# Patient Record
Sex: Female | Born: 1990
Health system: Southern US, Community
[De-identification: ages and names within clinical notes are randomized; demographics above are authoritative.]

## PROBLEM LIST (undated history)

## (undated) DIAGNOSIS — D649 Anemia, unspecified: Secondary | ICD-10-CM

## (undated) DIAGNOSIS — J309 Allergic rhinitis, unspecified: Secondary | ICD-10-CM

## (undated) DIAGNOSIS — N926 Irregular menstruation, unspecified: Secondary | ICD-10-CM

## (undated) DIAGNOSIS — J358 Other chronic diseases of tonsils and adenoids: Secondary | ICD-10-CM

## (undated) DIAGNOSIS — G43909 Migraine, unspecified, not intractable, without status migrainosus: Secondary | ICD-10-CM

## (undated) DIAGNOSIS — E876 Hypokalemia: Secondary | ICD-10-CM

## (undated) DIAGNOSIS — J45909 Unspecified asthma, uncomplicated: Secondary | ICD-10-CM

## (undated) DIAGNOSIS — R569 Unspecified convulsions: Secondary | ICD-10-CM

## (undated) DIAGNOSIS — R011 Cardiac murmur, unspecified: Secondary | ICD-10-CM

## (undated) DIAGNOSIS — F411 Generalized anxiety disorder: Secondary | ICD-10-CM

## (undated) DIAGNOSIS — T7840XA Allergy, unspecified, initial encounter: Secondary | ICD-10-CM

## (undated) DIAGNOSIS — L509 Urticaria, unspecified: Secondary | ICD-10-CM

## (undated) DIAGNOSIS — E785 Hyperlipidemia, unspecified: Secondary | ICD-10-CM

## (undated) HISTORY — DX: Irregular menstruation, unspecified: N92.6

## (undated) HISTORY — DX: Allergic rhinitis, unspecified: J30.9

## (undated) HISTORY — DX: Allergy, unspecified, initial encounter: T78.40XA

## (undated) HISTORY — PX: TONSILLECTOMY: SUR1361

## (undated) HISTORY — DX: Migraine, unspecified, not intractable, without status migrainosus: G43.909

## (undated) HISTORY — DX: Urticaria, unspecified: L50.9

## (undated) HISTORY — DX: Hypokalemia: E87.6

## (undated) HISTORY — DX: Cardiac murmur, unspecified: R01.1

## (undated) HISTORY — DX: Unspecified convulsions: R56.9

## (undated) HISTORY — DX: Hyperlipidemia, unspecified: E78.5

## (undated) HISTORY — PX: WISDOM TOOTH EXTRACTION: SHX21

## (undated) HISTORY — DX: Unspecified asthma, uncomplicated: J45.909

## (undated) HISTORY — DX: Anemia, unspecified: D64.9

## (undated) HISTORY — DX: Other chronic diseases of tonsils and adenoids: J35.8

## (undated) HISTORY — DX: Generalized anxiety disorder: F41.1

---
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
## 2015-08-10 ENCOUNTER — Emergency Department (HOSPITAL_COMMUNITY)
Admission: EM | Admit: 2015-08-10 | Discharge: 2015-08-10 | Disposition: A | Discharge status: Home / Self Care | Payer: BLUE CROSS/BLUE SHIELD | Attending: Emergency Medicine | Admitting: Emergency Medicine

## 2015-08-10 ENCOUNTER — Encounter (HOSPITAL_COMMUNITY): Payer: Self-pay | Admitting: *Deleted

## 2015-08-10 DIAGNOSIS — L731 Pseudofolliculitis barbae: Secondary | ICD-10-CM | POA: Diagnosis not present

## 2015-08-10 DIAGNOSIS — M542 Cervicalgia: Secondary | ICD-10-CM | POA: Diagnosis present

## 2015-08-10 MED ORDER — BACITRACIN ZINC 500 UNIT/GM EX OINT
TOPICAL_OINTMENT | Freq: Two times a day (BID) | CUTANEOUS | Status: DC
  Administered 2015-08-10: 23:00:00 via TOPICAL
  Filled 2015-08-10: qty 0.9

## 2015-08-10 NOTE — ED Notes (Signed)
Pt complains of boil to right neck since yesterday. Pt states she noticed a head on the boil today. Pt denies difficulty breathing or swallowing.

## 2015-08-10 NOTE — ED Provider Notes (Signed)
CSN: [id number]     Arrival date & time 08/10/15  2222 History  By signing my name below, I, Budd PalmerVanessa Prueter, attest that this documentation has been prepared under the direction and in the presence of Arman FilterGail K. Milynn Quirion, NP. Electronically Signed: Budd PalmerVanessa Prueter, ED Scribe. 08/10/2015. 10:50 PM.    Chief Complaint  Patient presents with  . Abscess   The history is provided by the patient. No language interpreter was used.   HPI Comments: Jasmine [name] is a 25 y.o. female who presents to the Emergency Department complaining of a possible abscess to the anterior neck onset yesterday.  Pt states that at first she thought it was just a zit, but that it has since grown in size and has also become more painful. Pt denies dental pain.   History reviewed. No pertinent past medical history. History reviewed. No pertinent past surgical history. No family history on file. Social History  Substance Use Topics  . Smoking status: Never Smoker   . Smokeless tobacco: None  . Alcohol Use: No   OB History    No data available     Review of Systems  Constitutional: Negative for fever.  HENT: Negative for dental problem.   Musculoskeletal: Positive for myalgias.  Skin: Positive for wound.  All other systems reviewed and are negative.   Allergies  Review of patient's allergies indicates not on file.  Home Medications   Prior to Admission medications   Not on File   BP 139/93 mmHg  Pulse 81  Temp(Src) 98.2 F (36.8 C) (Oral)  Resp 16  SpO2 99%  LMP 07/24/2015 Physical Exam  Constitutional: She is oriented to person, place, and time. She appears well-developed and well-nourished.  HENT:  Head: Normocephalic and atraumatic.  Eyes: Conjunctivae are normal. Right eye exhibits no discharge. Left eye exhibits no discharge.  Pulmonary/Chest: Effort normal. No respiratory distress.  Neurological: She is alert and oriented to person, place, and time. Coordination normal.  Skin: Skin is  warm and dry. No rash noted. She is not diaphoretic. No erythema.  Small white domed "pimple" R side of neck without surrounding erythema or swelling   Psychiatric: She has a normal mood and affect.  Nursing note and vitals reviewed.   ED Course  Procedures  DIAGNOSTIC STUDIES: Oxygen Saturation is 99% on RA, normal by my interpretation.    COORDINATION OF CARE: 10:49 PM - Discussed probable ingrown hair, which was freed. Discussed plans to apply antibiotic ointment and a bandaid. Pt advised of plan for treatment and pt agrees.  Labs Review Labs Reviewed - No data to display  Imaging Review No results found. I have personally reviewed and evaluated these images and lab results as part of my medical decision-making.   EKG Interpretation None     Gentle pressure on the "pimple" caused the area to open and reveal an ingrown hair  MDM   Final diagnoses:  Ingrown hair      Earley FavorGail Neelie Welshans, NP 08/10/15 2310  Melene Planan Floyd, DO 08/10/15 2348

## 2015-08-10 NOTE — Discharge Instructions (Signed)
Ingrown Hair An ingrown hair is a hair that curls and re-enters the skin instead of growing straight out of the skin. It happens most often with curly hair. It is usually more severe in the neck area, but it can occur in any shaved area, including the beard area, groin, scalp, and legs. An ingrown hair may cause small pockets of infection. CAUSES  Shaving closely, tweezing, or waxing, especially curly hair. Using hair removal creams can sometimes lead to ingrown hairs, especially in the groin. SYMPTOMS   Small bumps on the skin. The bumps may be filled with pus.  Pain.  Itching. DIAGNOSIS  Your caregiver can usually tell what is wrong by doing a physical exam. TREATMENT  If there is a severe infection, your caregiver may prescribe antibiotic medicines. Laser hair removal may also be done to help prevent regrowth of the hair. HOME CARE INSTRUCTIONS   Do not shave irritated skin. You may start shaving again once the irritation has gone away.  If you are prone to ingrown hairs, consider not shaving as much as possible.  If antibiotics are prescribed, take them as directed. Finish them even if you start to feel better.  You may use a facial sponge in a gentle circular motion to help dislodge ingrown hairs on the face.  You may use a hair removal cream weekly, especially on the legs and underarms. Stop using the cream if it irritates your skin. Use caution when using hair removal creams in the groin area. SHAVING INSTRUCTIONS AFTER TREATMENT  Shower before shaving. Keep areas to be shaved packed in warm, moist wraps for several minutes before shaving. The warm, moist environment helps soften the hairs and makes ingrown hairs less likely to occur.  Use thick shaving gels.  Use a bump fighter razor that cuts hair slightly above the skin level or use an electric shaver with a longer shave setting.  Shave in the direction of hair growth. Avoid making multiple razor strokes.  Use  moisturizing lotions after shaving.   This information is not intended to replace advice given to you by your health care provider. Make sure you discuss any questions you have with your health care provider.   Document Released: 08/13/2000 Document Revised: 11/06/2011 Document Reviewed: 08/07/2011 Elsevier Interactive Patient Education 2016 [employer]  

## 2015-12-01 ENCOUNTER — Ambulatory Visit: Payer: BLUE CROSS/BLUE SHIELD | Admitting: Family

## 2016-01-17 ENCOUNTER — Encounter (HOSPITAL_COMMUNITY): Payer: Self-pay | Admitting: Emergency Medicine

## 2016-01-17 ENCOUNTER — Ambulatory Visit (HOSPITAL_COMMUNITY)
Admission: EM | Admit: 2016-01-17 | Discharge: 2016-01-17 | Disposition: A | Discharge status: Home / Self Care | Payer: BLUE CROSS/BLUE SHIELD | Attending: Family Medicine | Admitting: Family Medicine

## 2016-01-17 DIAGNOSIS — J029 Acute pharyngitis, unspecified: Secondary | ICD-10-CM | POA: Diagnosis not present

## 2016-01-17 LAB — POCT RAPID STREP A: STREPTOCOCCUS, GROUP A SCREEN (DIRECT): NEGATIVE

## 2016-01-17 MED ORDER — LIDOCAINE VISCOUS 2 % MT SOLN: 20.0000 mL | OROMUCOSAL | 0 refills | Status: DC | PRN

## 2016-01-17 MED ORDER — AMOXICILLIN 875 MG PO TABS: 875.0000 mg | ORAL_TABLET | Freq: Two times a day (BID) | ORAL | 0 refills | Status: DC

## 2016-01-17 MED ORDER — ONDANSETRON 4 MG PO TBDP: 4.0000 mg | ORAL_TABLET | Freq: Three times a day (TID) | ORAL | 0 refills | Status: DC | PRN

## 2016-01-17 NOTE — ED Triage Notes (Signed)
[facility] a [facility] with nausea x 2 weeks. The patient stated that she has a hx of recurrent strep throat and has been advised to have her tonsils removed. The patient requested a referral.

## 2016-01-17 NOTE — ED Provider Notes (Signed)
CSN: [id number]     Arrival date & time 01/17/16  1148 History   None    Chief Complaint  Patient presents with  . Sore Throat   (Consider location/radiation/quality/duration/timing/severity/associated sxs/prior Treatment) Patient has sore throat and fever for 2 weeks.  She is also having nausea.   The history is provided by the patient.  Sore Throat  This is a new problem. The problem occurs constantly. The problem has been gradually worsening. Pertinent negatives include no chest pain. Nothing aggravates the symptoms. Nothing relieves the symptoms. She has tried nothing for the symptoms.    History reviewed. No pertinent past medical history. History reviewed. No pertinent surgical history. History reviewed. No pertinent family history. Social History  Substance Use Topics  . Smoking status: Never Smoker  . Smokeless tobacco: Never Used  . Alcohol use No   OB History    No data available     Review of Systems  Constitutional: Negative.   HENT: Positive for sore throat.   Eyes: Negative.   Respiratory: Negative.   Cardiovascular: Negative.  Negative for chest pain.  Gastrointestinal: Negative.   Endocrine: Negative.   Genitourinary: Negative.   Musculoskeletal: Negative.   Skin: Negative.   Allergic/Immunologic: Negative.   Neurological: Negative.   Hematological: Negative.   Psychiatric/Behavioral: Negative.     Allergies  Review of patient's allergies indicates no known allergies.  Home Medications   Prior to Admission medications   Not on File   Meds Ordered and Administered this Visit  Medications - No data to display  BP 136/83 (BP Location: Left Arm)   Pulse 88   Temp 100.1 F (37.8 C) (Oral)   Resp 18   LMP 12/08/2015 (Exact Date)   SpO2 100%  No data found.   Physical Exam  Constitutional: She is oriented to person, place, and time. She appears well-developed and well-nourished.  HENT:  Head: Normocephalic and atraumatic.  Right Ear:  External ear normal.  Left Ear: External ear normal.  opx injected and swollen tonsils w/o exudates  Eyes: Conjunctivae and EOM are normal. Pupils are equal, round, and reactive to light.  Neck: Normal range of motion. Neck supple.  Cardiovascular: Normal rate, regular rhythm and normal heart sounds.   Pulmonary/Chest: Effort normal and breath sounds normal.  Abdominal: Soft. Bowel sounds are normal.  Neurological: She is alert and oriented to person, place, and time.  Nursing note and vitals reviewed.   Urgent Care Course   Clinical Course    Procedures (including critical care time)  Labs Review Labs Reviewed  POCT RAPID STREP A    Imaging Review No results found.   Visual Acuity Review  Right Eye Distance:   Left Eye Distance:   Bilateral Distance:    Right Eye Near:   Left Eye Near:    Bilateral Near:         MDM  Pharyngitis - Amoxicillin 875mg  one po bid x 7 days #14 Lidocaine viscous solution 20 ml po tid prn #17720ml Push po fluids, rest, tylenol and motrin otc prn as directed for fever, arthralgias, and myalgias.  Follow up prn if sx's continue or persist.    Deatra CanterWilliam J Keiffer Piper, FNP 01/17/16 [phone]

## 2016-01-19 LAB — CULTURE, GROUP A STREP (THRC)

## 2016-05-21 ENCOUNTER — Encounter (HOSPITAL_COMMUNITY): Payer: Self-pay | Admitting: Emergency Medicine

## 2016-05-21 ENCOUNTER — Ambulatory Visit (HOSPITAL_COMMUNITY)
Admission: EM | Admit: 2016-05-21 | Discharge: 2016-05-21 | Disposition: A | Discharge status: Home / Self Care | Payer: BLUE CROSS/BLUE SHIELD | Attending: Family Medicine | Admitting: Family Medicine

## 2016-05-21 DIAGNOSIS — J029 Acute pharyngitis, unspecified: Secondary | ICD-10-CM

## 2016-05-21 MED ORDER — AZITHROMYCIN 250 MG PO TABS: ORAL_TABLET | ORAL | 0 refills | Status: DC

## 2016-05-21 MED ORDER — IPRATROPIUM BROMIDE 0.06 % NA SOLN: 2.0000 | Freq: Four times a day (QID) | NASAL | 1 refills | Status: DC

## 2016-05-21 NOTE — ED Triage Notes (Signed)
PT reports sore throat for 2 weeks that has worsened over last 3 days. PT also reports productive cough and congestion for 1 week. PT has tried several OTC meds

## 2016-05-21 NOTE — ED Provider Notes (Signed)
[facility]    CSN: [id number] Arrival date & time: 05/21/16  1003     History   Chief Complaint Chief Complaint  Patient presents with  . Sore Throat    HPI Jasmine [name] is a 26 y.o. female.   The history is provided by the patient.  Sore Throat  This is a new problem. The current episode started more than 1 week ago (2wks of sx.). The problem has been gradually worsening. Pertinent negatives include no chest pain, no abdominal pain, no headaches and no shortness of breath. The symptoms are aggravated by swallowing.    History reviewed. No pertinent past medical history.  There are no active problems to display for this patient.   History reviewed. No pertinent surgical history.  OB History    No data available       Home Medications    Prior to Admission medications   Medication Sig Start Date End Date Taking? Authorizing Provider  amoxicillin (AMOXIL) 875 MG tablet Take 1 tablet (875 mg total) by mouth 2 (two) times daily. 01/17/16   Deatra CanterWilliam J Oxford, FNP  lidocaine (XYLOCAINE) 2 % solution Use as directed 20 mLs in the mouth or throat as needed for mouth pain. 01/17/16   Deatra CanterWilliam J Oxford, FNP  ondansetron (ZOFRAN ODT) 4 MG disintegrating tablet Take 1 tablet (4 mg total) by mouth every 8 (eight) hours as needed for nausea or vomiting. 01/17/16   Deatra CanterWilliam J Oxford, FNP    Family History No family history on file.  Social History Social History  Substance Use Topics  . Smoking status: Never Smoker  . Smokeless tobacco: Never Used  . Alcohol use No     Allergies   Patient has no known allergies.   Review of Systems Review of Systems  Constitutional: Negative.   HENT: Positive for congestion, postnasal drip, rhinorrhea and sore throat.   Respiratory: Positive for cough. Negative for shortness of breath.   Cardiovascular: Negative.  Negative for chest pain.  Gastrointestinal: Negative.  Negative for abdominal pain.  Neurological:  Negative for headaches.     Physical Exam Triage Vital Signs ED Triage Vitals  Enc Vitals Group     BP 05/21/16 1021 135/88     Pulse Rate 05/21/16 1021 86     Resp 05/21/16 1021 16     Temp 05/21/16 1021 97.9 F (36.6 C)     Temp Source 05/21/16 1021 Oral     SpO2 05/21/16 1021 98 %     Weight 05/21/16 1023 175 lb (79.4 kg)     Height 05/21/16 1023 5\' 2"  (1.575 m)     Head Circumference --      Peak Flow --      Pain Score 05/21/16 1024 7     Pain Loc --      Pain Edu? --      Excl. in [place]? --    No data found.   Updated Vital Signs BP 135/88   Pulse 86   Temp 97.9 F (36.6 C) (Oral)   Resp 16   Ht 5\' 2"  (1.575 m)   Wt 175 lb (79.4 kg)   SpO2 98%   BMI 32.01 kg/m   Visual Acuity Right Eye Distance:   Left Eye Distance:   Bilateral Distance:    Right Eye Near:   Left Eye Near:    Bilateral Near:     Physical Exam  Constitutional: She is oriented to person, place, and time. She  appears well-developed and well-nourished.  HENT:  Right Ear: External ear normal.  Left Ear: External ear normal.  Nose: Mucosal edema and rhinorrhea present. No sinus tenderness.  Mouth/Throat: Oropharynx is clear and moist.  Neck: Normal range of motion.  Cardiovascular: Normal rate, regular rhythm, normal heart sounds and intact distal pulses.   Pulmonary/Chest: Effort normal and breath sounds normal.  Lymphadenopathy:    She has no cervical adenopathy.  Neurological: She is alert and oriented to person, place, and time.  Skin: Skin is warm and dry.  Nursing note and vitals reviewed.    UC Treatments / Results  Labs (all labs ordered are listed, but only abnormal results are displayed) Labs Reviewed - No data to display  EKG  EKG Interpretation None       Radiology No results found.  Procedures Procedures (including critical care time)  Medications Ordered in UC Medications - No data to display   Initial Impression / Assessment and Plan / UC Course  I  have reviewed the triage vital signs and the nursing notes.  Pertinent labs & imaging results that were available during my care of the patient were reviewed by me and considered in my medical decision making (see chart for details).  Clinical Course       Final Clinical Impressions(s) / UC Diagnoses   Final diagnoses:  None    New Prescriptions New Prescriptions   No medications on file     Linna Hoff, MD 06/05/16 2053

## 2016-05-21 NOTE — Discharge Instructions (Signed)
Drink lots of fluids, take all of medicine, use lozenges as needed.return if needed °

## 2018-02-26 ENCOUNTER — Other Ambulatory Visit: Payer: Self-pay | Admitting: Family Medicine

## 2018-02-26 DIAGNOSIS — R5381 Other malaise: Secondary | ICD-10-CM

## 2018-03-19 ENCOUNTER — Other Ambulatory Visit: Payer: Self-pay | Admitting: Family Medicine

## 2018-03-19 DIAGNOSIS — N926 Irregular menstruation, unspecified: Secondary | ICD-10-CM

## 2018-03-21 ENCOUNTER — Other Ambulatory Visit: Payer: Self-pay | Admitting: Family Medicine

## 2018-03-21 DIAGNOSIS — N926 Irregular menstruation, unspecified: Secondary | ICD-10-CM

## 2018-03-21 DIAGNOSIS — E282 Polycystic ovarian syndrome: Secondary | ICD-10-CM

## 2019-02-18 ENCOUNTER — Ambulatory Visit (HOSPITAL_COMMUNITY)
Admission: EM | Admit: 2019-02-18 | Discharge: 2019-02-18 | Disposition: A | Discharge status: Home / Self Care | Payer: BC Managed Care – PPO

## 2019-02-18 ENCOUNTER — Encounter (HOSPITAL_COMMUNITY): Payer: Self-pay | Admitting: Emergency Medicine

## 2019-02-18 ENCOUNTER — Other Ambulatory Visit: Payer: Self-pay

## 2019-02-18 DIAGNOSIS — L239 Allergic contact dermatitis, unspecified cause: Secondary | ICD-10-CM

## 2019-02-18 MED ORDER — METHYLPREDNISOLONE SODIUM SUCC 125 MG IJ SOLR: 125.0000 mg | Freq: Once | INTRAMUSCULAR | Status: DC

## 2019-02-18 MED ORDER — PREDNISONE 10 MG (21) PO TBPK: ORAL_TABLET | Freq: Every day | ORAL | 0 refills | Status: DC

## 2019-02-18 MED ORDER — METHYLPREDNISOLONE SODIUM SUCC 125 MG IJ SOLR
INTRAMUSCULAR | Status: AC
  Filled 2019-02-18: qty 2

## 2019-02-18 NOTE — Discharge Instructions (Addendum)
Take the prednisone as directed.      Continue to take Benadryl every 6 hours as needed for itching.    Stop using the new body lotion.    Return here or follow-up with your primary care provider if your symptoms or not improving.

## 2019-02-18 NOTE — ED Triage Notes (Signed)
Patient complains of itchy skin to arms, irritated feeling.  This itchiness started on Monday.    Patient has a new cat x 1 month  cetaphil lotion tried over the weekend-has been using this product on face with no issue

## 2019-02-18 NOTE — ED Provider Notes (Addendum)
[facility]    CSN: [id number] Arrival date & time: 02/18/19  3664      History   Chief Complaint Chief Complaint  Patient presents with  . Rash    HPI Jasmine [name] is a 28 y.o. female.   Patient presents with pruritic rash on her arms x3 days.  She began to use a new body lotion 4 days ago.  She also reports a new cat at home x1 month.  She has been treating this at home with Benadryl which has improved the rash.  She denies other symptoms including fever, chills, sore throat, difficulty swallowing, difficulty breathing, cough.  LMP: 02/18/2019.    The history is provided by the patient.    History reviewed. No pertinent past medical history.  There are no active problems to display for this patient.   History reviewed. No pertinent surgical history.  OB History   No obstetric history on file.      Home Medications    Prior to Admission medications   Medication Sig Start Date End Date Taking? Authorizing Provider  diphenhydrAMINE (BENADRYL) 25 MG tablet Take 25 mg by mouth every 6 (six) hours as needed.   Yes [provider]  Lisdexamfetamine Dimesylate (VYVANSE PO) Take by mouth.   Yes [provider]  amoxicillin (AMOXIL) 875 MG tablet Take 1 tablet (875 mg total) by mouth 2 (two) times daily. 01/17/16   Deatra Canter, FNP  azithromycin (ZITHROMAX Z-PAK) 250 MG tablet Take as directed on pack 05/21/16   Linna Hoff, MD  ipratropium (ATROVENT) 0.06 % nasal spray Place 2 sprays into both nostrils 4 (four) times daily. 05/21/16   Linna Hoff, MD  lidocaine (XYLOCAINE) 2 % solution Use as directed 20 mLs in the mouth or throat as needed for mouth pain. 01/17/16   Deatra Canter, FNP  ondansetron (ZOFRAN ODT) 4 MG disintegrating tablet Take 1 tablet (4 mg total) by mouth every 8 (eight) hours as needed for nausea or vomiting. 01/17/16   Deatra Canter, FNP  predniSONE (STERAPRED UNI-PAK 21 TAB) 10 MG (21) TBPK tablet  Take by mouth daily. Take 6 tabs by mouth daily  for 1 day, then 5 tabs for 1 day, then 4 tabs for 1 day, then 3 tabs for 1 day, 2 tabs for 1 day, then 1 tab by mouth daily for 1 day 02/18/19   Mickie Bail, NP    Family History Family History  Problem Relation Age of Onset  . Diabetes Mother   . Hypertension Mother   . Diabetes Father   . Hypertension Father     Social History Social History   Tobacco Use  . Smoking status: Never Smoker  . Smokeless tobacco: Never Used  Substance Use Topics  . Alcohol use: No  . Drug use: No     Allergies   Patient has no known allergies.   Review of Systems Review of Systems  Constitutional: Negative for chills and fever.  HENT: Negative for congestion, ear pain, rhinorrhea, sore throat and trouble swallowing.   Eyes: Negative for pain and visual disturbance.  Respiratory: Negative for cough and shortness of breath.   Cardiovascular: Negative for chest pain and palpitations.  Gastrointestinal: Negative for abdominal pain and vomiting.  Genitourinary: Negative for dysuria and hematuria.  Musculoskeletal: Negative for arthralgias and back pain.  Skin: Positive for rash. Negative for color change.  Neurological: Negative for seizures and syncope.  All other systems reviewed  and are negative.    Physical Exam Triage Vital Signs ED Triage Vitals  Enc Vitals Group     BP 02/18/19 0954 128/69     Pulse Rate 02/18/19 0954 91     Resp 02/18/19 0954 18     Temp 02/18/19 0954 98 F (36.7 C)     Temp Source 02/18/19 0954 Oral     SpO2 02/18/19 0954 100 %     Weight --      Height --      Head Circumference --      Peak Flow --      Pain Score 02/18/19 0947 0     Pain Loc --      Pain Edu? --      Excl. in [place]? --    No data found.  Updated Vital Signs BP 128/69 (BP Location: Right Arm)   Pulse 91   Temp 98 F (36.7 C) (Oral)   Resp 18   LMP 02/18/2019   SpO2 100%   Visual Acuity Right Eye Distance:   Left Eye  Distance:   Bilateral Distance:    Right Eye Near:   Left Eye Near:    Bilateral Near:     Physical Exam Vitals signs and nursing note reviewed.  Constitutional:      General: She is not in acute distress.    Appearance: She is well-developed.  HENT:     Head: Normocephalic and atraumatic.     Right Ear: Tympanic membrane normal.     Left Ear: Tympanic membrane normal.     Nose: Nose normal.     Mouth/Throat:     Mouth: Mucous membranes are moist.     Pharynx: Oropharynx is clear.  Eyes:     Conjunctiva/sclera: Conjunctivae normal.  Neck:     Musculoskeletal: Neck supple.  Cardiovascular:     Rate and Rhythm: Normal rate and regular rhythm.     Heart sounds: No murmur.  Pulmonary:     Effort: Pulmonary effort is normal. No respiratory distress.     Breath sounds: Normal breath sounds.  Abdominal:     Palpations: Abdomen is soft.     Tenderness: There is no abdominal tenderness. There is no guarding or rebound.  Skin:    General: Skin is warm and dry.     Findings: Rash present.     Comments: Papular rash on UE.  No drainage or erythema.    Neurological:     General: No focal deficit present.     Mental Status: She is alert and oriented to person, place, and time.      UC Treatments / Results  Labs (all labs ordered are listed, but only abnormal results are displayed) Labs Reviewed - No data to display  EKG   Radiology No results found.  Procedures Procedures (including critical care time)  Medications Ordered in UC Medications  methylPREDNISolone sodium succinate (SOLU-MEDROL) 125 mg/2 mL injection 125 mg (has no administration in time range)  methylPREDNISolone sodium succinate (SOLU-MEDROL) 125 mg/2 mL injection (has no administration in time range)    Initial Impression / Assessment and Plan / UC Course  I have reviewed the triage vital signs and the nursing notes.  Pertinent labs & imaging results that were available during my care of the patient  were reviewed by me and considered in my medical decision making (see chart for details).    Allergic dermatitis.  Treating with prednisone (patient refused injection).  Instructed patient  to continue taking Benadryl every 6 hours as needed.  Discussed that she should stop using the new body lotion.  Discussed that if her symptoms are not improving, consider whether she is allergic to the new cat.  Instructed her to return here or follow-up with her PCP if her symptoms are not improving.  Patient agrees to plan of care.     Final Clinical Impressions(s) / UC Diagnoses   Final diagnoses:  Allergic dermatitis     Discharge Instructions     Take the prednisone as directed.      Continue to take Benadryl every 6 hours as needed for itching.    Stop using the new body lotion.    Return here or follow-up with your primary care provider if your symptoms or not improving.          ED Prescriptions    Medication Sig Dispense Auth. Provider   predniSONE (STERAPRED UNI-PAK 21 TAB) 10 MG (21) TBPK tablet Take by mouth daily. Take 6 tabs by mouth daily  for 1 day, then 5 tabs for 1 day, then 4 tabs for 1 day, then 3 tabs for 1 day, 2 tabs for 1 day, then 1 tab by mouth daily for 1 day 21 tablet Daisa Stennis H, NP     PDMP notMickie Bail reviewed this encounter.   Mickie Bailate, Mahreen Schewe H, NP 02/18/19 1011    Mickie Bailate, Amritpal Shropshire H, NP 02/18/19 1020    Mickie Bailate, Dominic Mahaney H, NP 02/18/19 1020

## 2019-02-20 ENCOUNTER — Ambulatory Visit (HOSPITAL_COMMUNITY)
Admission: EM | Admit: 2019-02-20 | Discharge: 2019-02-20 | Disposition: A | Discharge status: Home / Self Care | Payer: BC Managed Care – PPO | Attending: Physician Assistant | Admitting: Physician Assistant

## 2019-02-20 ENCOUNTER — Encounter (HOSPITAL_COMMUNITY): Payer: Self-pay

## 2019-02-20 ENCOUNTER — Other Ambulatory Visit: Payer: Self-pay

## 2019-02-20 DIAGNOSIS — R21 Rash and other nonspecific skin eruption: Secondary | ICD-10-CM

## 2019-02-20 MED ORDER — HYDROXYZINE HCL 25 MG PO TABS: 25.0000 mg | ORAL_TABLET | Freq: Four times a day (QID) | ORAL | 0 refills | Status: DC | PRN

## 2019-02-20 NOTE — Discharge Instructions (Addendum)
Your rash appears allergic and will not go away until the allergen is removed. Start Zyrtec OTC daily. May take Hydroxyzine every 6 hours for itching and will make you sleepy. Finish the prednisone and take Benadryl 25mg  every 6 hours for reaction. If rash continues then f/u with an allergist for further evaluation.

## 2019-02-20 NOTE — ED Provider Notes (Signed)
[facility]    CSN: [id number] Arrival date & time: 02/20/19  1932      History   Chief Complaint Chief Complaint  Patient presents with  . Follow-up    HPI Jasmine [name] is a 28 y.o. female.   Presents with an ongoing rash since she was here on Tuesday. She states the itching is keeping her awake at night. No change in rash, some clearing but itching remains. No additional symptom that have manifested since Tuesday.      History reviewed. No pertinent past medical history.  There are no active problems to display for this patient.   History reviewed. No pertinent surgical history.  OB History   No obstetric history on file.      Home Medications    Prior to Admission medications   Medication Sig Start Date End Date Taking? Authorizing Provider  amoxicillin (AMOXIL) 875 MG tablet Take 1 tablet (875 mg total) by mouth 2 (two) times daily. 01/17/16   Deatra Canter, FNP  azithromycin (ZITHROMAX Z-PAK) 250 MG tablet Take as directed on pack 05/21/16   Linna Hoff, MD  diphenhydrAMINE (BENADRYL) 25 MG tablet Take 25 mg by mouth every 6 (six) hours as needed.    [provider]  hydrOXYzine (ATARAX/VISTARIL) 25 MG tablet Take 1 tablet (25 mg total) by mouth every 6 (six) hours as needed for itching. 02/20/19   Riki Sheer, PA-C  ipratropium (ATROVENT) 0.06 % nasal spray Place 2 sprays into both nostrils 4 (four) times daily. 05/21/16   Linna Hoff, MD  lidocaine (XYLOCAINE) 2 % solution Use as directed 20 mLs in the mouth or throat as needed for mouth pain. 01/17/16   Deatra Canter, FNP  Lisdexamfetamine Dimesylate (VYVANSE PO) Take by mouth.    [provider]  ondansetron (ZOFRAN ODT) 4 MG disintegrating tablet Take 1 tablet (4 mg total) by mouth every 8 (eight) hours as needed for nausea or vomiting. 01/17/16   Deatra Canter, FNP  predniSONE (STERAPRED UNI-PAK 21 TAB) 10 MG (21) TBPK tablet Take by mouth  daily. Take 6 tabs by mouth daily  for 1 day, then 5 tabs for 1 day, then 4 tabs for 1 day, then 3 tabs for 1 day, 2 tabs for 1 day, then 1 tab by mouth daily for 1 day 02/18/19   Mickie Bail, NP    Family History Family History  Problem Relation Age of Onset  . Diabetes Mother   . Hypertension Mother   . Diabetes Father   . Hypertension Father     Social History Social History   Tobacco Use  . Smoking status: Never Smoker  . Smokeless tobacco: Never Used  Substance Use Topics  . Alcohol use: No  . Drug use: No     Allergies   Patient has no known allergies.   Review of Systems Review of Systems  All other systems reviewed and are negative.    Physical Exam Triage Vital Signs ED Triage Vitals  Enc Vitals Group     BP 02/20/19 1950 (!) 142/99     Pulse Rate 02/20/19 1950 77     Resp 02/20/19 1950 17     Temp 02/20/19 1950 98.5 F (36.9 C)     Temp Source 02/20/19 1950 Oral     SpO2 02/20/19 1950 98 %     Weight --      Height --      Head Circumference --  Peak Flow --      Pain Score 02/20/19 1949 0     Pain Loc --      Pain Edu? --      Excl. in [place]? --    No data found.  Updated Vital Signs BP (!) 142/99 (BP Location: Right Arm)   Pulse 77   Temp 98.5 F (36.9 C) (Oral)   Resp 17   LMP 02/18/2019   SpO2 98%   Visual Acuity Right Eye Distance:   Left Eye Distance:   Bilateral Distance:    Right Eye Near:   Left Eye Near:    Bilateral Near:     Physical Exam Vitals signs and nursing note reviewed.  Constitutional:      General: She is not in acute distress.    Appearance: Normal appearance. She is not ill-appearing.  HENT:     Head: Normocephalic and atraumatic.  Skin:    General: Skin is warm and dry.     Findings: Rash present.     Comments: Mild papular rash to both forearms  Neurological:     Mental Status: She is alert.  Psychiatric:        Mood and Affect: Mood normal.        Behavior: Behavior normal.      UC  Treatments / Results  Labs (all labs ordered are listed, but only abnormal results are displayed) Labs Reviewed  NOVEL CORONAVIRUS, NAA    EKG   Radiology No results found.  Procedures Procedures (including critical care time)  Medications Ordered in UC Medications - No data to display  Initial Impression / Assessment and Plan / UC Course  I have reviewed the triage vital signs and the nursing notes.  Pertinent labs & imaging results that were available during my care of the patient were reviewed by me and considered in my medical decision making (see chart for details).     Continue prednisone add Zyrtec and Hydroxyzine for itching. Give additional time for clearing. Consider allergist f/u if continues.  Final Clinical Impressions(s) / UC Diagnoses   Final diagnoses:  Rash and nonspecific skin eruption     Discharge Instructions     Your rash appears allergic and will not go away until the allergen is removed. Start Zyrtec OTC daily. May take Hydroxyzine every 6 hours for itching and will make you sleepy. Finish the prednisone and take Benadryl 25mg  every 6 hours for reaction. If rash continues then f/u with an allergist for further evaluation.    ED Prescriptions    Medication Sig Dispense Auth. Provider   hydrOXYzine (ATARAX/VISTARIL) 25 MG tablet Take 1 tablet (25 mg total) by mouth every 6 (six) hours as needed for itching. 15 tablet Bjorn Pippin, [place]     PDMP not reviewed this encounter.   Bjorn Pippin, [place] 02/20/19 2007

## 2019-02-20 NOTE — ED Triage Notes (Signed)
Patient presents to Urgent Care with complaints of needing follow-up for an allergic reaction since her itching does not seem to be improving. Patient reports the rash is worse on her arms. Pt states she still has the cat and it spends a lot of time in her room.

## 2019-02-26 ENCOUNTER — Other Ambulatory Visit: Payer: Self-pay

## 2019-02-26 ENCOUNTER — Encounter (HOSPITAL_COMMUNITY): Payer: Self-pay

## 2019-02-26 ENCOUNTER — Ambulatory Visit (HOSPITAL_COMMUNITY)
Admission: EM | Admit: 2019-02-26 | Discharge: 2019-02-26 | Disposition: A | Discharge status: Home / Self Care | Payer: BC Managed Care – PPO | Attending: Emergency Medicine | Admitting: Emergency Medicine

## 2019-02-26 DIAGNOSIS — L509 Urticaria, unspecified: Secondary | ICD-10-CM | POA: Insufficient documentation

## 2019-02-26 DIAGNOSIS — Z20828 Contact with and (suspected) exposure to other viral communicable diseases: Secondary | ICD-10-CM | POA: Insufficient documentation

## 2019-02-26 DIAGNOSIS — R21 Rash and other nonspecific skin eruption: Secondary | ICD-10-CM | POA: Diagnosis not present

## 2019-02-26 MED ORDER — DIPHENHYDRAMINE HCL 50 MG/ML IJ SOLN
25.0000 mg | Freq: Once | INTRAMUSCULAR | Status: AC
  Administered 2019-02-26: 25 mg via INTRAMUSCULAR

## 2019-02-26 MED ORDER — NEOMYCIN-POLYMYXIN-PRAMOXINE 1 % EX CREA: TOPICAL_CREAM | Freq: Two times a day (BID) | CUTANEOUS | Status: DC

## 2019-02-26 MED ORDER — HYDROXYZINE HCL 25 MG PO TABS: 25.0000 mg | ORAL_TABLET | Freq: Four times a day (QID) | ORAL | 1 refills | Status: DC | PRN

## 2019-02-26 NOTE — ED Provider Notes (Signed)
[place]    CSN: [id number] Arrival date & time: 02/26/19  1906      History   Chief Complaint Chief Complaint  Patient presents with  . Rash  . Shortness of Breath    HPI Jasmine [name] is a 28 y.o. female.   Pt states that she has had this rash since 9/28 itching, got  A new cat 2 motnhs ago but does not feel that this is the problem bumps states that she has been here several times for this no relief. Also states that she is worried it is the covid rash and would like to be tested. Has some sob today only with itching. Has been taking benadryl, hydrazine, steroids, with no relief. No fever, no cough, rash is over entire body. Has called derm but can not see them till she is off steroid for 3 days . Is asking for a covid test ,,      History reviewed. No pertinent past medical history.  There are no active problems to display for this patient.   History reviewed. No pertinent surgical history.  OB History   No obstetric history on file.      Home Medications    Prior to Admission medications   Medication Sig Start Date End Date Taking? Authorizing Provider  cetirizine (ZYRTEC) 10 MG tablet Take 10 mg by mouth daily.   Yes [provider]  amoxicillin (AMOXIL) 875 MG tablet Take 1 tablet (875 mg total) by mouth 2 (two) times daily. 01/17/16   Lysbeth Penner, FNP  azithromycin (ZITHROMAX Z-PAK) 250 MG tablet Take as directed on pack 05/21/16   Billy Fischer, MD  diphenhydrAMINE (BENADRYL) 25 MG tablet Take 25 mg by mouth every 6 (six) hours as needed.    [provider]  hydrOXYzine (ATARAX/VISTARIL) 25 MG tablet Take 1 tablet (25 mg total) by mouth every 6 (six) hours as needed for itching. 02/26/19   Marney Setting, NP  ipratropium (ATROVENT) 0.06 % nasal spray Place 2 sprays into both nostrils 4 (four) times daily. 05/21/16   Billy Fischer, MD  lidocaine (XYLOCAINE) 2 % solution Use as directed 20 mLs in the mouth  or throat as needed for mouth pain. 01/17/16   Lysbeth Penner, FNP  Lisdexamfetamine Dimesylate (VYVANSE PO) Take by mouth.    [provider]  ondansetron (ZOFRAN ODT) 4 MG disintegrating tablet Take 1 tablet (4 mg total) by mouth every 8 (eight) hours as needed for nausea or vomiting. 01/17/16   Lysbeth Penner, FNP  predniSONE (STERAPRED UNI-PAK 21 TAB) 10 MG (21) TBPK tablet Take by mouth daily. Take 6 tabs by mouth daily  for 1 day, then 5 tabs for 1 day, then 4 tabs for 1 day, then 3 tabs for 1 day, 2 tabs for 1 day, then 1 tab by mouth daily for 1 day 02/18/19   Sharion Balloon, NP    Family History Family History  Problem Relation Age of Onset  . Diabetes Mother   . Hypertension Mother   . Diabetes Father   . Hypertension Father     Social History Social History   Tobacco Use  . Smoking status: Never Smoker  . Smokeless tobacco: Never Used  Substance Use Topics  . Alcohol use: No  . Drug use: No     Allergies   Patient has no known allergies.   Review of Systems Review of Systems  Constitutional:  Anxious, upset   HENT: Negative.   Eyes: Negative.   Respiratory: Positive for shortness of breath.   Cardiovascular: Negative.   Gastrointestinal: Negative.   Skin: Positive for rash.       Small raised area to entire body, red scratching over several areas   Neurological: Negative.      Physical Exam Triage Vital Signs ED Triage Vitals  Enc Vitals Group     BP 02/26/19 1921 137/79     Pulse Rate 02/26/19 1921 99     Resp 02/26/19 1921 17     Temp 02/26/19 1921 98.3 F (36.8 C)     Temp Source 02/26/19 1921 Oral     SpO2 02/26/19 1921 100 %     Weight --      Height --      Head Circumference --      Peak Flow --      Pain Score 02/26/19 1918 0     Pain Loc --      Pain Edu? --      Excl. in [place]? --    No data found.  Updated Vital Signs BP 137/79 (BP Location: Right Arm)   Pulse 99   Temp 98.3 F (36.8 C) (Oral)   Resp 17   LMP  02/18/2019   SpO2 100%   Visual Acuity Right Eye Distance:   Left Eye Distance:   Bilateral Distance:    Right Eye Near:   Left Eye Near:    Bilateral Near:     Physical Exam Constitutional:      Comments: Anxious   HENT:     Mouth/Throat:     Mouth: Mucous membranes are moist.  Eyes:     Pupils: Pupils are equal, round, and reactive to light.  Neck:     Musculoskeletal: Normal range of motion.  Cardiovascular:     Rate and Rhythm: Normal rate.  Pulmonary:     Effort: Pulmonary effort is normal.  Abdominal:     Palpations: Abdomen is soft.  Skin:    Findings: Erythema and rash present.     Comments: Scratching marks pt is itching cont. No open pustules ,   Neurological:     General: No focal deficit present.     Mental Status: She is alert.      UC Treatments / Results  Labs (all labs ordered are listed, but only abnormal results are displayed) Labs Reviewed  NOVEL CORONAVIRUS, NAA (HOSP ORDER, SEND-OUT TO REF LAB; TAT 18-24 HRS)  SARS CORONAVIRUS 2 (TAT 6-24 HRS)    EKG   Radiology No results found.  Procedures Procedures (including critical care time)  Medications Ordered in UC Medications  neomycin-polymyxin-pramoxine (NEOSPORIN PLUS) cream (has no administration in time range)  diphenhydrAMINE (BENADRYL) injection 25 mg (25 mg Intramuscular Given 02/26/19 1955)    Initial Impression / Assessment and Plan / UC Course  I have reviewed the triage vital signs and the nursing notes.  Pertinent labs & imaging results that were available during my care of the patient were reviewed by me and considered in my medical decision making (see chart for details).    Md nelson seen pt  Reviewed pervious charts  given recommendations of meds pt thinks it is covid and is asking for a test .  Expressed that we will call if any results are positive. At this time MD does not think we can offer any further tx except OTC creams . And to double the zyrtec  dose .  May  need to r/o the possibility of cat allergy  Final Clinical Impressions(s) / UC Diagnoses   Final diagnoses:  Rash  Urticaria     Discharge Instructions     With speaking to MD Delton SeeNelson she recommends to treat with a topical cream. May double to dose of Zyrtec to q12 hrs and to take 2 25 mg benadryl at night. Will need to see dermatology to see for further causes .     ED Prescriptions    Medication Sig Dispense Auth. Provider   hydrOXYzine (ATARAX/VISTARIL) 25 MG tablet Take 1 tablet (25 mg total) by mouth every 6 (six) hours as needed for itching. 15 tablet Coralyn MarkMitchell, Cadience Bradfield L, NP     PDMP not reviewed this encounter.   Coralyn MarkMitchell, Aryon Nham L, NP 02/26/19 2017

## 2019-02-26 NOTE — ED Triage Notes (Addendum)
Pt reports shortness of breath x 2 days, rash in her arms, back and face is getting worse, she was seen for the same chief complaint 1 week ago at this UC.

## 2019-02-26 NOTE — Discharge Instructions (Addendum)
With speaking to MD Meda Coffee she recommends to treat with a topical cream. May double to dose of Zyrtec to q12 hrs and to take 2 25 mg benadryl at night. Will need to see dermatology to see for further causes .

## 2019-03-01 LAB — NOVEL CORONAVIRUS, NAA (HOSP ORDER, SEND-OUT TO REF LAB; TAT 18-24 HRS): SARS-CoV-2, NAA: NOT DETECTED

## 2019-03-09 ENCOUNTER — Other Ambulatory Visit: Payer: Self-pay

## 2019-03-09 ENCOUNTER — Encounter: Payer: Self-pay | Admitting: Allergy and Immunology

## 2019-03-09 ENCOUNTER — Ambulatory Visit (INDEPENDENT_AMBULATORY_CARE_PROVIDER_SITE_OTHER): Payer: BC Managed Care – PPO | Admitting: Allergy and Immunology

## 2019-03-09 VITALS — BP 126/78 | HR 100 | Temp 97.8°F | Resp 18 | Ht 61.5 in | Wt 172.8 lb

## 2019-03-09 DIAGNOSIS — L500 Allergic urticaria: Secondary | ICD-10-CM | POA: Diagnosis not present

## 2019-03-09 DIAGNOSIS — T7840XD Allergy, unspecified, subsequent encounter: Secondary | ICD-10-CM | POA: Diagnosis not present

## 2019-03-09 DIAGNOSIS — T7840XA Allergy, unspecified, initial encounter: Secondary | ICD-10-CM | POA: Insufficient documentation

## 2019-03-09 DIAGNOSIS — J310 Chronic rhinitis: Secondary | ICD-10-CM | POA: Diagnosis not present

## 2019-03-09 MED ORDER — LEVOCETIRIZINE DIHYDROCHLORIDE 5 MG PO TABS
5.0000 mg | ORAL_TABLET | Freq: Every day | ORAL | 5 refills | Status: DC | PRN
Start: 2019-03-09 — End: 2019-11-15

## 2019-03-09 MED ORDER — FAMOTIDINE 20 MG PO TABS: 20.0000 mg | ORAL_TABLET | Freq: Two times a day (BID) | ORAL | 5 refills | Status: DC | PRN

## 2019-03-09 MED ORDER — AZELASTINE HCL 0.1 % NA SOLN: 1.0000 | Freq: Two times a day (BID) | NASAL | 5 refills | Status: DC | PRN

## 2019-03-09 NOTE — Assessment & Plan Note (Addendum)
Unclear etiology.  Allergy skin tests to food and environmental allergens were negative today. NSAIDs and emotional stress commonly exacerbate urticaria but are not the underlying etiology in this case. Physical urticarias are negative by history (i.e. pressure-induced, temperature, vibration, solar, etc.). History and lesions are not consistent with urticaria pigmentosa so I am not suspicious for mastocytosis. There are no concomitant symptoms concerning for anaphylaxis or constitutional symptoms worrisome for an underlying malignancy.   We will not order labs at this time, however, if lesions recur, persist, progress, or change in character, we will assess potential etiologies with screening labs.  For symptom relief, patient is to take oral antihistamines as directed.  Instructions have been discussed and provided for H1/H2 receptor blockade with titration to find lowest effective dose.  A prescription has been provided for levocetirizine(Xyzal), 5 mg daily as needed.  A prescription has been provided for famotidine (Pepcid), 20 mg twice daily if needed.  Should symptoms recur, a journal is to be kept recording any foods eaten, beverages consumed, medications taken within a 6 hour period prior to the onset of symptoms, as well as record activities being performed, and environmental conditions. For any symptoms concerning for anaphylaxis, 911 is to be called immediately.

## 2019-03-09 NOTE — Assessment & Plan Note (Signed)
Environmental epicutaneous and intradermal tests were negative today despite a positive histamine control.  A prescription has been provided for azelastine nasal spray, 1-2 sprays per nostril 2 times daily as needed. Proper nasal spray technique has been discussed and demonstrated.   Nasal saline spray (i.e., Simply Saline) or nasal saline lavage (i.e., NeilMed) is recommended as needed and prior to medicated nasal sprays.

## 2019-03-09 NOTE — Progress Notes (Signed)
New Patient Note  RE: [name] MRN: [id number] DOB: 1991-01-29 Date of Office Visit: 03/09/2019  Referring provider: No ref. provider found Primary care provider: Fanny Bien, MD  Chief Complaint: Urticaria, Rash, and Nasal Congestion  History of present illness: Jasmine [name] is a 28 y.o. female presenting today for evaluation of allergic rhinitis and rash.  She reports that on September 28 she developed a rash on her hands and arms consisting of "small red bumps, really bad."  She went to the local urgent care for further evaluation and was treated with prednisone.  After the prednisone course ended "it came back even worse" with hives on her face and forehead.  Individual hives resolved within 24 hours without residual pigmentation or bruising, however the episode lasted for approximately 1 week.  She went back to urgent care and was instructed to seek evaluation by an allergist.  Approximately 2 weeks ago she acquired a cat brought in the house.  This was the family's first pet in the home.  As a result, she was concerned that there may have been a correlation between the hives and exposure to the cat.  Therefore, she removed the cat from her bedroom.  However, over the past week the cat has been back in the bedroom and she has not had a recurrence of the rash/hives. Laquitha experiences nasal congestion, rhinorrhea, postnasal drainage, and coughing.  No significant seasonal symptom variation has been noted nor have specific environmental triggers been identified.  She is currently taking montelukast without significant symptom relief.  Assessment and plan: Urticaria Unclear etiology.  Allergy skin tests to food and environmental allergens were negative today. NSAIDs and emotional stress commonly exacerbate urticaria but are not the underlying etiology in this case. Physical urticarias are negative by history (i.e. pressure-induced,  temperature, vibration, solar, etc.). History and lesions are not consistent with urticaria pigmentosa so I am not suspicious for mastocytosis. There are no concomitant symptoms concerning for anaphylaxis or constitutional symptoms worrisome for an underlying malignancy.   We will not order labs at this time, however, if lesions recur, persist, progress, or change in character, we will assess potential etiologies with screening labs.  For symptom relief, patient is to take oral antihistamines as directed.  Instructions have been discussed and provided for H1/H2 receptor blockade with titration to find lowest effective dose.  A prescription has been provided for levocetirizine(Xyzal), 5 mg daily as needed.  A prescription has been provided for famotidine (Pepcid), 20 mg twice daily if needed.  Should symptoms recur, a journal is to be kept recording any foods eaten, beverages consumed, medications taken within a 6 hour period prior to the onset of symptoms, as well as record activities being performed, and environmental conditions. For any symptoms concerning for anaphylaxis, 911 is to be called immediately.  Nonallergic rhinitis Environmental epicutaneous and intradermal tests were negative today despite a positive histamine control.  A prescription has been provided for azelastine nasal spray, 1-2 sprays per nostril 2 times daily as needed. Proper nasal spray technique has been discussed and demonstrated.   Nasal saline spray (i.e., Simply Saline) or nasal saline lavage (i.e., NeilMed) is recommended as needed and prior to medicated nasal sprays.   Meds ordered this encounter  Medications  . levocetirizine (XYZAL) 5 MG tablet    Sig: Take 1 tablet (5 mg total) by mouth daily as needed for allergies.    Dispense:  30 tablet    Refill:  5  .  famotidine (PEPCID) 20 MG tablet    Sig: Take 1 tablet (20 mg total) by mouth 2 (two) times daily as needed for heartburn or indigestion.    Dispense:   60 tablet    Refill:  5  . azelastine (ASTELIN) 0.1 % nasal spray    Sig: Place 1-2 sprays into both nostrils 2 (two) times daily as needed for rhinitis.    Dispense:  30 mL    Refill:  5    Diagnostics: Epicutaneous testing: Negative despite a positive histamine control. Intradermal testing: Negative despite a positive histamine control.  Physical examination: Blood pressure 126/78, pulse 100, temperature 97.8 F (36.6 C), temperature source Temporal, resp. rate 18, height 5' 1.5" (1.562 m), weight 172 lb 12.8 oz (78.4 kg), last menstrual period 02/18/2019, SpO2 100 %.  General: Alert, interactive, in no acute distress. HEENT: TMs pearly gray, turbinates mildly edematous without discharge, post-pharynx mildly erythematous. Neck: Supple without lymphadenopathy. Lungs: Clear to auscultation without wheezing, rhonchi or rales. CV: Normal S1, S2 without murmurs. Abdomen: Nondistended, nontender. Skin: Warm and dry, without lesions or rashes. Extremities:  No clubbing, cyanosis or edema. Neuro:   Grossly intact.  Review of systems:  Review of systems negative except as noted in HPI / PMHx or noted below: Review of Systems  Constitutional: Negative.   HENT: Negative.   Eyes: Negative.   Respiratory: Negative.   Cardiovascular: Negative.   Gastrointestinal: Negative.   Genitourinary: Negative.   Musculoskeletal: Negative.   Skin: Negative.   Neurological: Negative.   Endo/Heme/Allergies: Negative.   Psychiatric/Behavioral: Negative.     Past medical history:  Past Medical History:  Diagnosis Date  . Urticaria     Past surgical history:  Past Surgical History:  Procedure Laterality Date  . TONSILLECTOMY      Family history: Family History  Problem Relation Age of Onset  . Diabetes Mother   . Hypertension Mother   . Diabetes Father   . Hypertension Father   . Allergic rhinitis Brother   . Angioedema Neg Hx   . Asthma Neg Hx   . Atopy Neg Hx   . Eczema Neg Hx    . Immunodeficiency Neg Hx   . Urticaria Neg Hx     Social history: Social History   Socioeconomic History  . Marital status: Single    Spouse name: Not on file  . Number of children: Not on file  . Years of education: Not on file  . Highest education level: Not on file  Occupational History  . Not on file  Social Needs  . Financial resource strain: Not on file  . Food insecurity    Worry: Not on file    Inability: Not on file  . Transportation needs    Medical: Not on file    Non-medical: Not on file  Tobacco Use  . Smoking status: Never Smoker  . Smokeless tobacco: Never Used  Substance and Sexual Activity  . Alcohol use: No  . Drug use: No  . Sexual activity: Not on file  Lifestyle  . Physical activity    Days per week: Not on file    Minutes per session: Not on file  . Stress: Not on file  Relationships  . Social Musician on phone: Not on file    Gets together: Not on file    Attends religious service: Not on file    Active member of club or organization: Not on file    Attends  meetings of clubs or organizations: Not on file    Relationship status: Not on file  . Intimate partner violence    Fear of current or ex partner: Not on file    Emotionally abused: Not on file    Physically abused: Not on file    Forced sexual activity: Not on file  Other Topics Concern  . Not on file  Social History Narrative  . Not on file   Environmental History: Patient lives in a 27 year old house with hardwood floors throughout and central air/heat.  There is a cat in the home which has access to her bedroom.  There is no known mold/water damage in the home.  She is a non-smoker and is not exposed to secondhand cigarette smoke in the house or car.  Current Outpatient Medications  Medication Sig Dispense Refill  . cetirizine (ZYRTEC) 10 MG tablet Take 10 mg by mouth daily.    . diphenhydrAMINE (BENADRYL) 25 MG tablet Take 25 mg by mouth every 6 (six) hours as  needed.    . hydrOXYzine (ATARAX/VISTARIL) 25 MG tablet Take 1 tablet (25 mg total) by mouth every 6 (six) hours as needed for itching. 15 tablet 1  . montelukast (SINGULAIR) 10 MG tablet     . VYVANSE 40 MG CHEW CSW 1 T PO D    . azelastine (ASTELIN) 0.1 % nasal spray Place 1-2 sprays into both nostrils 2 (two) times daily as needed for rhinitis. 30 mL 5  . famotidine (PEPCID) 20 MG tablet Take 1 tablet (20 mg total) by mouth 2 (two) times daily as needed for heartburn or indigestion. 60 tablet 5  . levocetirizine (XYZAL) 5 MG tablet Take 1 tablet (5 mg total) by mouth daily as needed for allergies. 30 tablet 5   No current facility-administered medications for this visit.     Known medication allergies: No Known Allergies  I appreciate the opportunity to take part in Willodene's care. Please do not hesitate to contact me with questions.  Sincerely,   R. Jorene Guestarter Demeco Ducksworth, MD

## 2019-03-09 NOTE — Patient Instructions (Addendum)
Urticaria Unclear etiology.  Allergy skin tests to food and environmental allergens were negative today. NSAIDs and emotional stress commonly exacerbate urticaria but are not the underlying etiology in this case. Physical urticarias are negative by history (i.e. pressure-induced, temperature, vibration, solar, etc.). History and lesions are not consistent with urticaria pigmentosa so I am not suspicious for mastocytosis. There are no concomitant symptoms concerning for anaphylaxis or constitutional symptoms worrisome for an underlying malignancy.   We will not order labs at this time, however, if lesions recur, persist, progress, or change in character, we will assess potential etiologies with screening labs.  For symptom relief, patient is to take oral antihistamines as directed.  Instructions have been discussed and provided for H1/H2 receptor blockade with titration to find lowest effective dose.  A prescription has been provided for levocetirizine(Xyzal), 5 mg daily as needed.  A prescription has been provided for famotidine (Pepcid), 20 mg twice daily if needed.  Should symptoms recur, a journal is to be kept recording any foods eaten, beverages consumed, medications taken within a 6 hour period prior to the onset of symptoms, as well as record activities being performed, and environmental conditions. For any symptoms concerning for anaphylaxis, 911 is to be called immediately.  Nonallergic rhinitis Environmental epicutaneous and intradermal tests were negative today despite a positive histamine control.  A prescription has been provided for azelastine nasal spray, 1-2 sprays per nostril 2 times daily as needed. Proper nasal spray technique has been discussed and demonstrated.   Nasal saline spray (i.e., Simply Saline) or nasal saline lavage (i.e., NeilMed) is recommended as needed and prior to medicated nasal sprays.   Follow-up if needed.   Urticaria (Hives)  . Levocetirizine (Xyzal)  5 mg twice a day and famotidine (Pepcid) 20 mg twice a day. If no symptoms for 7-14 days then decrease to. . Levocetirizine (Xyzal) 5 mg twice a day and famotidine (Pepcid) 20 mg once a day.  If no symptoms for 7-14 days then decrease to. . Levocetirizine (Xyzal) 5 mg twice a day.  If no symptoms for 7-14 days then decrease to. . Levocetirizine (Xyzal) 5 mg once a day.  May use Benadryl (diphenhydramine) as needed for breakthrough symptoms       If symptoms return, then step up dosage

## 2019-03-22 DIAGNOSIS — U071 COVID-19: Secondary | ICD-10-CM

## 2019-03-22 HISTORY — DX: COVID-19: U07.1

## 2019-04-07 ENCOUNTER — Other Ambulatory Visit: Payer: Self-pay

## 2019-04-07 DIAGNOSIS — Z20822 Contact with and (suspected) exposure to covid-19: Secondary | ICD-10-CM

## 2019-04-09 LAB — NOVEL CORONAVIRUS, NAA: SARS-CoV-2, NAA: DETECTED — AB

## 2019-05-12 ENCOUNTER — Other Ambulatory Visit: Payer: BC Managed Care – PPO

## 2019-05-20 ENCOUNTER — Other Ambulatory Visit: Payer: Self-pay | Admitting: Cardiology

## 2019-05-20 DIAGNOSIS — Z20822 Contact with and (suspected) exposure to covid-19: Secondary | ICD-10-CM

## 2019-05-21 LAB — NOVEL CORONAVIRUS, NAA: SARS-CoV-2, NAA: NOT DETECTED

## 2019-06-03 ENCOUNTER — Other Ambulatory Visit: Payer: Self-pay

## 2019-06-03 ENCOUNTER — Ambulatory Visit (INDEPENDENT_AMBULATORY_CARE_PROVIDER_SITE_OTHER): Payer: BC Managed Care – PPO | Admitting: Critical Care Medicine

## 2019-06-03 ENCOUNTER — Encounter: Payer: Self-pay | Admitting: Critical Care Medicine

## 2019-06-03 VITALS — BP 128/90 | HR 123 | Temp 97.9°F | Ht 62.5 in | Wt 173.8 lb

## 2019-06-03 DIAGNOSIS — R062 Wheezing: Secondary | ICD-10-CM | POA: Diagnosis not present

## 2019-06-03 DIAGNOSIS — Z8616 Personal history of COVID-19: Secondary | ICD-10-CM | POA: Diagnosis not present

## 2019-06-03 DIAGNOSIS — J4540 Moderate persistent asthma, uncomplicated: Secondary | ICD-10-CM | POA: Diagnosis not present

## 2019-06-03 DIAGNOSIS — R0609 Other forms of dyspnea: Secondary | ICD-10-CM

## 2019-06-03 DIAGNOSIS — R0600 Dyspnea, unspecified: Secondary | ICD-10-CM

## 2019-06-03 DIAGNOSIS — Z23 Encounter for immunization: Secondary | ICD-10-CM

## 2019-06-03 MED ORDER — PREDNISONE 50 MG PO TABS: ORAL_TABLET | ORAL | 0 refills | Status: DC

## 2019-06-03 NOTE — Patient Instructions (Addendum)
Thank you for visiting Dr. Chestine Spore at [facility]. We recommend the following: Orders Placed This Encounter  Procedures  . Pulmonary function test   Orders Placed This Encounter  Procedures  . Pulmonary function test    Standing Status:   Future    Standing Expiration Date:   06/02/2020    Order Specific Question:   Where should this test be performed?    Answer:   [facility]    Order Specific Question:   Full PFT: includes the following: basic spirometry, spirometry pre & post bronchodilator, diffusion capacity (DLCO), lung volumes    Answer:   Full PFT    Meds ordered this encounter  Medications  . predniSONE (DELTASONE) 50 MG tablet    Sig: Take 1 pill once daily by mouth.    Dispense:  5 tablet    Refill:  0     Keep taking Symbicort 2 puffs twice daily. Keep using albuterol every 4 hours PRN. Keep using Xyzal and singulair once daily.   Return in about 2 weeks (around 06/17/2019).    Please do your part to reduce the spread of COVID-19.

## 2019-06-03 NOTE — Progress Notes (Signed)
Synopsis: Referred in January 2021 for post-covid by Lewis Moccasin, MD.  Subjective:   PATIENT ID: Margaret Pyle GENDER: female DOB: Oct 03, 1990, MRN: [id number]  Chief Complaint  Patient presents with  . Consult    Patient is here post Covid in November. Patient states cough is gone for the most part but she has wheezing that she can't get rid of. Patient also has chest pains    Ms. Korf is a 29 y/o woman who presents for evaluation of dyspnea, wheezing, chest pain persistent after having Covid in mid November 2020.  She and her entire family had Covid at the same time, and everyone else's symptoms have fully resolved.  She previously had a severe cough, which has since improved.  She continues to have shortness of breath, which causes wheezing, which frequently causes her chest pain.  Her chest pain is on the right side along her sternum and she describes as aching.  It worsens when she carries heavy things and resolves with rest.  Overall it has been improving with coughing less.  Overall her symptoms have improved slowly, but have not fully resolved.  Talking exacerbates her symptoms.  He has had more headaches since having Covid.  Her sore throat has resolved.  She was started on Symbicort twice daily and albuterol as needed, which she has been taking every 4-5 hours due to wheezing.  She was given a course of azithromycin in late November, which did not improve her symptoms.  She was prescribed prednisone, but does not think that this was ever picked up from the pharmacy and taken.  She is also taken Tessalon, continued her chronic montelukast for allergies, and has been started on levocetirizine.  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  She has a history of childhood  asthma, but has been fine during her teens and 25s.  She inhalers as a child, but is not sure which ones.  She was never hospitalized for asthma as a child. She has a family history of allergies and one brother.  There is no other family history of asthma or lung disease.  Has not yet had her flu vaccine, but would like to do that today.         Past Medical History:  Diagnosis Date  . Allergic rhinitis   . Anemia   . COVID-19 03/2019  . History of positive PPD 2011  . Hyperlipidemia   . Urticaria      Family History  Problem Relation Age of Onset  . Diabetes Mother   . Hypertension Mother   . Diabetes Father   . Hypertension Father   . Allergic rhinitis Brother   . Angioedema Neg Hx   . Asthma Neg Hx   . Atopy Neg Hx   . Eczema Neg Hx   . Immunodeficiency Neg Hx   . Urticaria Neg Hx      Past Surgical History:  Procedure Laterality Date  . TONSILLECTOMY      Social History   Socioeconomic History  . Marital status: Single    Spouse name: Not on file  . Number of children: Not on file  . Years of education: Not on file  . Highest education level: Not on file  Occupational History  . Not on file  Tobacco Use  . Smoking status: Never Smoker  . Smokeless tobacco: Never Used  Substance and Sexual Activity  . Alcohol use: No  . Drug use: No  . Sexual activity: Not on file  Other Topics Concern  . Not on file  Social History Narrative  . Not on file   Social Determinants of Health   Financial Resource Strain:   . Difficulty of Paying Living Expenses: Not on file  Food Insecurity:   . Worried About Charity fundraiser in the Last Year: Not on file  . Ran Out of Food in the Last Year: Not on file  Transportation Needs:   . Lack of Transportation (Medical): Not on file  . Lack of Transportation (Non-Medical): Not on file  Physical Activity:   . Days of Exercise per Week: Not on file  . Minutes of Exercise per Session: Not on file  Stress:   . Feeling  of Stress : Not on file  Social Connections:   . Frequency of Communication with Friends and Family: Not on file  . Frequency of Social Gatherings with Friends and Family: Not on file  . Attends Religious Services: Not on file  . Active Member of Clubs or Organizations: Not on file  . Attends Archivist Meetings: Not on file  . Marital Status: Not on file  Intimate Partner Violence:   . Fear of Current or Ex-Partner: Not on file  . Emotionally Abused: Not on file  . Physically Abused: Not on file  . Sexually Abused: Not on file     No Known Allergies    There is no immunization history on file for this patient.  Outpatient Medications Prior to Visit  Medication Sig Dispense Refill  . albuterol (VENTOLIN HFA) 108 (90 Base) MCG/ACT inhaler Inhale 1 puff into the lungs as needed.    . benzonatate (TESSALON) 100 MG capsule Take 100 mg by mouth 3 (three) times daily.    Marland Kitchen levocetirizine (XYZAL) 5 MG tablet Take 1 tablet (5 mg total) by mouth daily as needed for allergies. 30 tablet 5  . montelukast (SINGULAIR) 10 MG tablet     . SYMBICORT 160-4.5 MCG/ACT inhaler Inhale 2 puffs into the lungs 2 (two) times daily.    Marland Kitchen VYVANSE 40 MG CHEW CSW 1 T PO D    . azelastine (ASTELIN) 0.1 % nasal spray Place 1-2 sprays into both nostrils 2 (two) times daily as needed for rhinitis. (Patient not taking: Reported on 06/03/2019) 30 mL 5  . cetirizine (ZYRTEC) 10 MG tablet Take 10 mg by mouth daily.    . diphenhydrAMINE (BENADRYL) 25 MG tablet Take 25 mg by mouth every 6 (six) hours as needed.    . famotidine (PEPCID) 20 MG tablet Take 1 tablet (20 mg total) by mouth 2 (two) times daily as needed for heartburn or indigestion. (Patient not taking: Reported on 06/03/2019) 60 tablet 5  . hydrOXYzine (ATARAX/VISTARIL) 25 MG tablet Take 1 tablet (25 mg total) by mouth every 6 (six) hours as needed for itching. (Patient not taking: Reported on 06/03/2019) 15 tablet 1   No facility-administered  medications prior to visit.    Review of Systems  Constitutional: Negative for fever and weight loss.  HENT: Negative for congestion and sore throat.   Respiratory: Positive for shortness of breath and wheezing. Negative for cough.   Cardiovascular: Positive for chest pain.  Neurological: Positive for headaches.  Objective:   Vitals:   06/03/19 1559  BP: 128/90  Pulse: (!) 123  Temp: 97.9 F (36.6 C)  TempSrc: Temporal  SpO2: 98%  Weight: 173 lb 12.8 oz (78.8 kg)  Height: 5' 2.5" (1.588 m)   98% on   RA BMI Readings from Last 3 Encounters:  06/03/19 31.28 kg/m  03/09/19 32.12 kg/m  05/21/16 32.01 kg/m   Wt Readings from Last 3 Encounters:  06/03/19 173 lb 12.8 oz (78.8 kg)  03/09/19 172 lb 12.8 oz (78.4 kg)  05/21/16 175 lb (79.4 kg)    Physical Exam Vitals reviewed.  Constitutional:      General: She is not in acute distress.    Appearance: Normal appearance. She is not ill-appearing.  HENT:     Head: Normocephalic and atraumatic.     Nose:     Comments: Deferred due to masking requirement.    Mouth/Throat:     Comments: Deferred due to masking requirement. Eyes:     General: No scleral icterus. Cardiovascular:     Rate and Rhythm: Regular rhythm.     Heart sounds: No murmur.     Comments: tachycardic Pulmonary:     Comments: Reading comfortably on room air, occasional mild coughing.  Clear to auscultation bilaterally. Abdominal:     General: There is no distension.     Palpations: Abdomen is soft.     Tenderness: There is no abdominal tenderness.  Musculoskeletal:        General: No swelling or deformity.     Cervical back: Neck supple.  Lymphadenopathy:     Cervical: No cervical adenopathy.  Skin:    General: Skin is warm and dry.     Findings: No rash.  Neurological:     General: No focal deficit present.     Mental Status: She is alert.     Motor: No weakness.     Coordination: Coordination normal.  Psychiatric:     Comments:  Anxious appearing, cooperative with exam      CBC No results found for: WBC, RBC, HGB, HCT, PLT, MCV, MCH, MCHC, RDW, LYMPHSABS, MONOABS, EOSABS, BASOSABS  CHEMISTRY No results for input(s): NA, K, CL, CO2, GLUCOSE, BUN, CREATININE, CALCIUM, MG, PHOS in the last 168 hours. CrCl cannot be calculated (No successful lab value found.).   Chest Imaging- films reviewed: CXR, 2 view 11/08/2009-normal  Pulmonary Functions Testing Results: No flowsheet data found.     Assessment & Plan:     ICD-10-CM   1. History of COVID-19  Z86.16 Pulmonary function test  2. Wheezing  R06.2 Pulmonary function test  3. Moderate persistent asthma, unspecified whether complicated  J45.40 Pulmonary function test  4. Need for immunization against influenza  Z23 Flu Vaccine QUAD 36+ mos IM  5. DOE (dyspnea on exertion)  R06.00      Persistent wheezing and shortness of breath persisting after COVID-19 infection.  Suspect this is caused reactivation of her childhood asthma.  Currently having symptoms consistent with moderate persistent asthma. -Continue Symbicort twice daily -Continue albuterol every 4 hours as needed -Prednisone 50 mg x 5 days -No additional antibiotics at this time. -PFTs  -With her very frequent hiccuping-like coughing episodes, I do not think that a CT scan will be possible right now due to motion artifact.  We can revisit this once her wheezing is better controlled. -Continue allergy medications -Continue COVID-19 precautions-social distancing, mask wearing, handwashing.  She was educated that she is at risk for repeated infection, although this  appears to be rare. -Recommend COVID-19 vaccination once available to her age group.  RTC in 2 weeks for televisit.    Current Outpatient Medications:  .  albuterol (VENTOLIN HFA) 108 (90 Base) MCG/ACT inhaler, Inhale 1 puff into the lungs as needed., Disp: , Rfl:  .  benzonatate (TESSALON) 100 MG capsule, Take 100 mg by mouth 3 (three)  times daily., Disp: , Rfl:  .  levocetirizine (XYZAL) 5 MG tablet, Take 1 tablet (5 mg total) by mouth daily as needed for allergies., Disp: 30 tablet, Rfl: 5 .  montelukast (SINGULAIR) 10 MG tablet, , Disp: , Rfl:  .  SYMBICORT 160-4.5 MCG/ACT inhaler, Inhale 2 puffs into the lungs 2 (two) times daily., Disp: , Rfl:  .  VYVANSE 40 MG CHEW, CSW 1 T PO D, Disp: , Rfl:  .  azelastine (ASTELIN) 0.1 % nasal spray, Place 1-2 sprays into both nostrils 2 (two) times daily as needed for rhinitis. (Patient not taking: Reported on 06/03/2019), Disp: 30 mL, Rfl: 5 .  cetirizine (ZYRTEC) 10 MG tablet, Take 10 mg by mouth daily., Disp: , Rfl:  .  diphenhydrAMINE (BENADRYL) 25 MG tablet, Take 25 mg by mouth every 6 (six) hours as needed., Disp: , Rfl:  .  famotidine (PEPCID) 20 MG tablet, Take 1 tablet (20 mg total) by mouth 2 (two) times daily as needed for heartburn or indigestion. (Patient not taking: Reported on 06/03/2019), Disp: 60 tablet, Rfl: 5 .  hydrOXYzine (ATARAX/VISTARIL) 25 MG tablet, Take 1 tablet (25 mg total) by mouth every 6 (six) hours as needed for itching. (Patient not taking: Reported on 06/03/2019), Disp: 15 tablet, Rfl: 1 .  predniSONE (DELTASONE) 50 MG tablet, Take 1 pill once daily by mouth., Disp: 5 tablet, Rfl: 0   I spent 60 minutes on this encounter, including face to face time and non-face to face time spent reviewing records, charting, coordinating care, etc.   Steffanie Dunn, DO [facility] 06/03/2019 4:40 PM

## 2019-06-17 ENCOUNTER — Encounter: Payer: Self-pay | Admitting: Critical Care Medicine

## 2019-06-17 ENCOUNTER — Ambulatory Visit (INDEPENDENT_AMBULATORY_CARE_PROVIDER_SITE_OTHER): Payer: BC Managed Care – PPO | Admitting: Critical Care Medicine

## 2019-06-17 ENCOUNTER — Other Ambulatory Visit: Payer: Self-pay

## 2019-06-17 ENCOUNTER — Ambulatory Visit: Payer: BC Managed Care – PPO | Admitting: Critical Care Medicine

## 2019-06-17 DIAGNOSIS — Z8616 Personal history of COVID-19: Secondary | ICD-10-CM

## 2019-06-17 DIAGNOSIS — J4540 Moderate persistent asthma, uncomplicated: Secondary | ICD-10-CM | POA: Diagnosis not present

## 2019-06-17 MED ORDER — FLUTICASONE-SALMETEROL 500-50 MCG/DOSE IN AEPB: 1.0000 | INHALATION_SPRAY | Freq: Two times a day (BID) | RESPIRATORY_TRACT | 1 refills | Status: DC

## 2019-06-17 NOTE — Progress Notes (Signed)
Synopsis: Referred in January 2021 for post-covid by Fanny Bien, MD.  Subjective:   PATIENT ID: Jasmine [name] GENDER: female DOB: 01/14/1991, MRN: [id number]  Chief Complaint  Patient presents with  . Follow-up    Patient is doing follow up for Asthma and post covid. Patient has noticed an improvment since last visit with wheezing and shortness of breath. Patient was having chest tightness last Thursday and went to her PCP and was given a 10 day taper for Prednisone. Her PFT is scheduled for March.    Virtual Visit via phone note  I connected with Jasmine [name] on 06/17/19 at  9:30 AM EST by a video enabled telemedicine application and verified that I am speaking with the correct person using two identifiers.  Location: Patient: home Provider: [facility]- [address]   I discussed the limitations of evaluation and management by telemedicine and the availability of in person appointments. The patient expressed understanding and agreed to proceed.   I discussed the assessment and treatment plan with the patient. The patient was provided an opportunity to ask questions and all were answered. The patient agreed with the plan and demonstrated an understanding of the instructions.   The patient was advised to call back or seek an in-person evaluation if the symptoms worsen or if the condition fails to improve as anticipated.    Jasmine [name] is a 29 year old woman for follow-up of post Covid asthma.  Since her dose of steroids and starting back on Symbicort last week her symptoms have improved-her cough is much better.  She still uses her albuterol about every 4 hours, with initial improvement in her symptoms, but it wears off after about 3 hours.  She had an episode where she woke up with bilateral chest tightness and heaviness last week, and was restarted on prednisone by her primary care physician with improvement in her symptoms.  She  continues to use Symbicort twice daily, Xyzal and Singulair once daily.  She has not been using her azelastine nasal spray or Pepcid that were prescribed for allergic reactions in the fall.    06/03/19 OV: Jasmine [name] is a 29 y/o woman who presents for evaluation of dyspnea, wheezing, chest pain persistent after having Covid in mid November 2020.  She and her entire family had Covid at the same time, and everyone else's symptoms have fully resolved.  She previously had a severe cough, which has since improved.  She continues to have shortness of breath, which causes wheezing, which frequently causes her chest pain.  Her chest pain is on the right side along her sternum and she describes as aching.  It worsens when she carries heavy things and resolves with rest.  Overall it has been improving with coughing less.  Overall her symptoms have improved slowly, but have not fully resolved.  Talking exacerbates her symptoms.  He has had more headaches since having Covid.  Her sore throat has resolved.  She was started on Symbicort twice daily and albuterol as needed, which she has been taking every 4-5 hours due to wheezing.  She was given a course of azithromycin in late November, which did not improve her symptoms.  She was prescribed prednisone, but does not think that this was ever picked up from the pharmacy and taken.  She is also taken Tessalon, continued her chronic montelukast for allergies, and has been started on levocetirizine.  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  She has a history of childhood asthma, but has been fine during her teens and 29s.  She inhalers as a child, but is not sure which ones.  She was never hospitalized for asthma as a child. She has a family history of allergies and one brother.   There is no other family history of asthma or lung disease.  Has not yet had her flu vaccine, but would like to do that today.     Past Medical History:  Diagnosis Date  . Allergic rhinitis   . Anemia   . COVID-19 03/2019  . History of positive PPD 2011  . Hyperlipidemia   . Urticaria      Family History  Problem Relation Age of Onset  . Diabetes Mother   . Hypertension Mother   . Diabetes Father   . Hypertension Father   . Allergic rhinitis Brother   . Angioedema Neg Hx   . Asthma Neg Hx   . Atopy Neg Hx   . Eczema Neg Hx   . Immunodeficiency Neg Hx   . Urticaria Neg Hx      Past Surgical History:  Procedure Laterality Date  . TONSILLECTOMY      Social History   Socioeconomic History  . Marital status: Single    Spouse name: Not on file  . Number of children: Not on file  . Years of education: Not on file  . Highest education level: Not on file  Occupational History  . Occupation: Fish farm manager: [employer]  Tobacco Use  . Smoking status: Never Smoker  . Smokeless tobacco: Never Used  Substance and Sexual Activity  . Alcohol use: No  . Drug use: No  . Sexual activity: Not on file  Other Topics Concern  . Not on file  Social History Narrative  . Not on file   Social Determinants of Health   Financial Resource Strain:   . Difficulty of Paying Living Expenses: Not on file  Food Insecurity:   . Worried About Programme researcher, broadcasting/film/video in the Last Year: Not on file  . Ran Out of Food in the Last Year: Not on file  Transportation Needs:   . Lack of Transportation (Medical): Not on file  . Lack of Transportation (Non-Medical): Not on file  Physical Activity:   . Days of Exercise per Week: Not on file  . Minutes of Exercise per Session: Not on file  Stress:   . Feeling of Stress : Not on file  Social Connections:   . Frequency of Communication with Friends and Family: Not on file  . Frequency of Social Gatherings with Friends and Family: Not on file    . Attends Religious Services: Not on file  . Active Member of Clubs or Organizations: Not on file  . Attends Banker Meetings: Not on file  . Marital Status: Not on file  Intimate Partner Violence:   . Fear of Current or Ex-Partner: Not on file  . Emotionally Abused: Not on file  . Physically Abused: Not on file  . Sexually Abused: Not on file     No Known Allergies   Immunization History  Administered Date(s) Administered  . Influenza,inj,Quad PF,6+ Mos 06/03/2019    Outpatient Medications Prior to Visit  Medication Sig Dispense Refill  . albuterol (VENTOLIN HFA) 108 (90 Base) MCG/ACT inhaler Inhale 1 puff into the lungs as needed.    Marland Kitchen azelastine (ASTELIN) 0.1 % nasal spray Place 1-2 sprays into both nostrils 2 (two) times daily as needed for rhinitis. 30 mL 5  . benzonatate (TESSALON) 100 MG capsule Take 100 mg by mouth 3 (three) times daily.    . cetirizine (ZYRTEC) 10 MG tablet Take 10 mg by mouth daily.    . diphenhydrAMINE (BENADRYL) 25 MG tablet Take 25 mg by mouth every 6 (six) hours as needed.    . famotidine (PEPCID) 20 MG tablet Take 1 tablet (20 mg total) by mouth 2 (two) times daily as needed for heartburn or indigestion. 60 tablet 5  . hydrOXYzine (ATARAX/VISTARIL) 25 MG tablet Take 1 tablet (25 mg total) by mouth every 6 (six) hours as needed for itching. 15 tablet 1  . levocetirizine (XYZAL) 5 MG tablet Take 1 tablet (5 mg total) by mouth daily as needed for allergies. 30 tablet 5  . montelukast (SINGULAIR) 10 MG tablet     . predniSONE (DELTASONE) 50 MG tablet Take 1 pill once daily by mouth. 5 tablet 0  . SYMBICORT 160-4.5 MCG/ACT inhaler Inhale 2 puffs into the lungs 2 (two) times daily.    Marland Kitchen VYVANSE 40 MG CHEW CSW 1 T PO D     No facility-administered medications prior to visit.    Review of Systems  Constitutional: Negative for fever and weight loss.  HENT: Negative for congestion and sore throat.   Respiratory: Positive for shortness of  breath and wheezing. Negative for cough.   Cardiovascular: Positive for chest pain.  Neurological: Positive for headaches.     Objective:   There were no vitals filed for this visit.   on   RA BMI Readings from Last 3 Encounters:  06/03/19 31.28 kg/m  03/09/19 32.12 kg/m  05/21/16 32.01 kg/m   Wt Readings from Last 3 Encounters:  06/03/19 173 lb 12.8 oz (78.8 kg)  03/09/19 172 lb 12.8 oz (78.4 kg)  05/21/16 175 lb (79.4 kg)    Physical Exam-limited due to televisit.  No conversational dyspnea, no observed coughing.  Answering questions appropriately.   CBC No results found for: WBC, RBC, HGB, HCT, PLT, MCV, MCH, MCHC, RDW, LYMPHSABS, MONOABS, EOSABS, BASOSABS  CHEMISTRY No results for input(s): NA, K, CL, CO2, GLUCOSE, BUN, CREATININE, CALCIUM, MG, PHOS in the last 168 hours. CrCl cannot be calculated (No successful lab value found.).   Chest Imaging- films reviewed: CXR, 2 view 11/08/2009-normal  Pulmonary Functions Testing Results: No flowsheet data found.     Assessment & Plan:     ICD-10-CM   1. History of COVID-19  Z86.16   2. Moderate persistent asthma, unspecified whether complicated  J45.40     Persistent wheezing and shortness of breath persisting after COVID-19 infection.  Suspect this is caused reactivation of her childhood asthma.  Currently having symptoms consistent with moderate persistent asthma. -Stop Symbicort and start Advair 500-50 twice daily.  Instructed to rinse her mouth after use -Continue albuterol every 4 as needed -Complete current course of prednisone; will need to address need for Biologics if we are unable to control her without recurrent doses of prednisone.  Will need CBC with differential and IgE level if we are considering this -PFTs scheduled in March -Restart azelastine nasal spray and Pepcid -Continue levocetirizine and Singulair  RTC in 2 weeks for televisit.  17 minutes spent on  this encounter, including chart review,  time spent on phone with patient, and charting.    Current Outpatient Medications:  .  albuterol (VENTOLIN HFA) 108 (90 Base) MCG/ACT inhaler, Inhale 1 puff into the lungs as needed., Disp: , Rfl:  .  azelastine (ASTELIN) 0.1 % nasal spray, Place 1-2 sprays into both nostrils 2 (two) times daily as needed for rhinitis., Disp: 30 mL, Rfl: 5 .  benzonatate (TESSALON) 100 MG capsule, Take 100 mg by mouth 3 (three) times daily., Disp: , Rfl:  .  cetirizine (ZYRTEC) 10 MG tablet, Take 10 mg by mouth daily., Disp: , Rfl:  .  diphenhydrAMINE (BENADRYL) 25 MG tablet, Take 25 mg by mouth every 6 (six) hours as needed., Disp: , Rfl:  .  famotidine (PEPCID) 20 MG tablet, Take 1 tablet (20 mg total) by mouth 2 (two) times daily as needed for heartburn or indigestion., Disp: 60 tablet, Rfl: 5 .  hydrOXYzine (ATARAX/VISTARIL) 25 MG tablet, Take 1 tablet (25 mg total) by mouth every 6 (six) hours as needed for itching., Disp: 15 tablet, Rfl: 1 .  levocetirizine (XYZAL) 5 MG tablet, Take 1 tablet (5 mg total) by mouth daily as needed for allergies., Disp: 30 tablet, Rfl: 5 .  montelukast (SINGULAIR) 10 MG tablet, , Disp: , Rfl:  .  predniSONE (DELTASONE) 50 MG tablet, Take 1 pill once daily by mouth., Disp: 5 tablet, Rfl: 0 .  SYMBICORT 160-4.5 MCG/ACT inhaler, Inhale 2 puffs into the lungs 2 (two) times daily., Disp: , Rfl:  .  VYVANSE 40 MG CHEW, CSW 1 T PO D, Disp: , Rfl:  .  Fluticasone-Salmeterol (ADVAIR DISKUS) 500-50 MCG/DOSE AEPB, Inhale 1 puff into the lungs 2 (two) times daily., Disp: 60 each, Rfl: 1    Steffanie Dunn, DO [facility] 06/17/2019 10:03 AM

## 2019-06-17 NOTE — Patient Instructions (Addendum)
Thank you for visiting Dr. Chestine Spore at [facility]. We recommend the following: No orders of the defined types were placed in this encounter.  No orders of the defined types were placed in this encounter.   Meds ordered this encounter  Medications  . Fluticasone-Salmeterol (ADVAIR DISKUS) 500-50 MCG/DOSE AEPB    Sig: Inhale 1 puff into the lungs 2 (two) times daily.    Dispense:  60 each    Refill:  1    Return in about 2 weeks (around 07/01/2019).    Please do your part to reduce the spread of COVID-19.

## 2019-07-08 ENCOUNTER — Ambulatory Visit: Payer: BC Managed Care – PPO | Admitting: Critical Care Medicine

## 2019-07-08 ENCOUNTER — Other Ambulatory Visit: Payer: Self-pay

## 2019-07-08 ENCOUNTER — Ambulatory Visit (INDEPENDENT_AMBULATORY_CARE_PROVIDER_SITE_OTHER): Payer: BC Managed Care – PPO | Admitting: Critical Care Medicine

## 2019-07-08 ENCOUNTER — Telehealth: Payer: Self-pay | Admitting: Critical Care Medicine

## 2019-07-08 DIAGNOSIS — J4540 Moderate persistent asthma, uncomplicated: Secondary | ICD-10-CM

## 2019-07-08 DIAGNOSIS — R040 Epistaxis: Secondary | ICD-10-CM | POA: Diagnosis not present

## 2019-07-08 NOTE — Patient Instructions (Addendum)
Thank you for visiting Dr. Chestine Spore at [facility]. We recommend the following:  Keep using inhalers, cetirizine, montelukast as prescribed. Use humidifiers until temperature is warm up some in the heat is not running as much.  Stop flonase and azelastine nasal spray for a few days. When your nosebleeds have stopped for a few days, you can start back your nasal sprays 1 at a time.  Start Flonase first.    Return in about 6 weeks (around 08/19/2019).    Please do your part to reduce the spread of COVID-19.

## 2019-07-08 NOTE — Progress Notes (Signed)
Synopsis: Referred in January 2021 for post-covid by Jasmine Moccasin, MD.  Subjective:   PATIENT ID: Jasmine [name] GENDER: female DOB: 01-09-1991, MRN: [id number]  Chief Complaint  Patient presents with  . Follow-up    Patients shortness of breath and cough has got better. Patient has been having nose bleeds recently.     Virtual Visit via phone note  I connected with Jasmine [name] on 07/08/19 at  4:30 PM EST by a video enabled telemedicine application and verified that I am speaking with the correct person using two identifiers.  Location: Patient: home Provider: [facility]- [address]   I discussed the limitations of evaluation and management by telemedicine and the availability of in person appointments. The patient expressed understanding and agreed to proceed.   I discussed the assessment and treatment plan with the patient. The patient was provided an opportunity to ask questions and all were answered. The patient agreed with the plan and demonstrated an understanding of the instructions.   The patient was advised to call back or seek an in-person evaluation if the symptoms worsen or if the condition fails to improve as anticipated.    Jasmine [name] is following up for epistaxis that has been going on for 2 weeks. She is worried it is due to the meds she is on. Due to the colder temps, the heat has been running more, and she has stopped using her humidifier at work. Still on Advair, levocetirizine, montelukast, azelastine, flonase. Cough & SOB both much better. CP is also improved. She completed the prednisone but had increased appetite and did not like taking it.     06/03/19 OV: Jasmine [name] is a 29 y/o woman who presents for evaluation of dyspnea, wheezing, chest pain persistent after having Covid in mid November 2020.  She and her entire family had Covid at the same time, and everyone else's symptoms have fully resolved.   She previously had a severe cough, which has since improved.  She continues to have shortness of breath, which causes wheezing, which frequently causes her chest pain.  Her chest pain is on the right side along her sternum and she describes as aching.  It worsens when she carries heavy things and resolves with rest.  Overall it has been improving with coughing less.  Overall her symptoms have improved slowly, but have not fully resolved.  Talking exacerbates her symptoms.  He has had more headaches since having Covid.  Her sore throat has resolved.  She was started on Symbicort twice daily and albuterol as needed, which she has been taking every 4-5 hours due to wheezing.  She was given a course of azithromycin in late November, which did not improve her symptoms.  She was prescribed prednisone, but does not think that this was ever picked up from the pharmacy and taken.  She is also taken Tessalon, continued her chronic montelukast for allergies, and has been started on levocetirizine.  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  She has a history of childhood asthma, but has been fine during her teens and 79s.  She inhalers as a child, but is not sure which ones.  She was never hospitalized for asthma as a child. She has  a family history of allergies and one brother.  There is no other family history of asthma or lung disease.  Has not yet had her flu vaccine, but would like to do that today.     Past Medical History:  Diagnosis Date  . Allergic rhinitis   . Anemia   . COVID-19 03/2019  . History of positive PPD 2011  . Hyperlipidemia   . Urticaria      Family History  Problem Relation Age of Onset  . Diabetes Mother   . Hypertension Mother   . Diabetes Father   . Hypertension Father   . Allergic  rhinitis Brother   . Angioedema Neg Hx   . Asthma Neg Hx   . Atopy Neg Hx   . Eczema Neg Hx   . Immunodeficiency Neg Hx   . Urticaria Neg Hx      Past Surgical History:  Procedure Laterality Date  . TONSILLECTOMY      Social History   Socioeconomic History  . Marital status: Single    Spouse name: Not on file  . Number of children: Not on file  . Years of education: Not on file  . Highest education level: Not on file  Occupational History  . 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 Smoking status: Never Smoker  . Smokeless tobacco: Never Used  Substance and Sexual Activity  . Alcohol use: No  . Drug use: No  . Sexual activity: Not on file  Other Topics Concern  . Not on file  Social History Narrative  . Not on file   Social Determinants of Health   Financial Resource Strain:   . Difficulty of Paying Living Expenses: Not on file  Food Insecurity:   . Worried About Charity fundraiser in the Last Year: Not on file  . Ran Out of Food in the Last Year: Not on file  Transportation Needs:   . Lack of Transportation (Medical): Not on file  . Lack of Transportation (Non-Medical): Not on file  Physical Activity:   . Days of Exercise per Week: Not on file  . Minutes of Exercise per Session: Not on file  Stress:   . Feeling of Stress : Not on file  Social Connections:   . Frequency of Communication with Friends and Family: Not on file  . Frequency of Social Gatherings with Friends and Family: Not on file  . Attends Religious Services: Not on file  . Active Member of Clubs or Organizations: Not on file  . Attends Archivist Meetings: Not on file  . Marital Status: Not on file  Intimate Partner Violence:   . Fear of Current or Ex-Partner: Not on file  . Emotionally Abused: Not on file  . Physically Abused: Not on file  . Sexually Abused: Not on file     No Known Allergies   Immunization History  Administered Date(s) Administered  .  Influenza,inj,Quad PF,6+ Mos 06/03/2019    Outpatient Medications Prior to Visit  Medication Sig Dispense Refill  . albuterol (VENTOLIN HFA) 108 (90 Base) MCG/ACT inhaler Inhale 1 puff into the lungs as needed.    Marland Kitchen azelastine (ASTELIN) 0.1 % nasal spray Place 1-2 sprays into both nostrils 2 (two) times daily as needed for rhinitis. 30 mL 5  . benzonatate (TESSALON) 100 MG capsule Take 100 mg by mouth 3 (three) times daily.    . cetirizine (ZYRTEC) 10 MG tablet Take 10 mg by  mouth daily.    . diphenhydrAMINE (BENADRYL) 25 MG tablet Take 25 mg by mouth every 6 (six) hours as needed.    . famotidine (PEPCID) 20 MG tablet Take 1 tablet (20 mg total) by mouth 2 (two) times daily as needed for heartburn or indigestion. 60 tablet 5  . Fluticasone-Salmeterol (ADVAIR DISKUS) 500-50 MCG/DOSE AEPB Inhale 1 puff into the lungs 2 (two) times daily. 60 each 1  . hydrOXYzine (ATARAX/VISTARIL) 25 MG tablet Take 1 tablet (25 mg total) by mouth every 6 (six) hours as needed for itching. 15 tablet 1  . levocetirizine (XYZAL) 5 MG tablet Take 1 tablet (5 mg total) by mouth daily as needed for allergies. 30 tablet 5  . montelukast (SINGULAIR) 10 MG tablet     . predniSONE (DELTASONE) 50 MG tablet Take 1 pill once daily by mouth. 5 tablet 0  . SYMBICORT 160-4.5 MCG/ACT inhaler Inhale 2 puffs into the lungs 2 (two) times daily.    Marland Kitchen VYVANSE 40 MG CHEW CSW 1 T PO D     No facility-administered medications prior to visit.    Review of Systems  Constitutional: Negative for fever and weight loss.  HENT: Negative for congestion and sore throat.   Respiratory: Positive for shortness of breath and wheezing. Negative for cough.   Cardiovascular: Positive for chest pain.  Neurological: Positive for headaches.     Objective:   There were no vitals filed for this visit.   on   RA BMI Readings from Last 3 Encounters:  06/03/19 31.28 kg/m  03/09/19 32.12 kg/m  05/21/16 32.01 kg/m   Wt Readings from Last 3  Encounters:  06/03/19 173 lb 12.8 oz (78.8 kg)  03/09/19 172 lb 12.8 oz (78.4 kg)  05/21/16 175 lb (79.4 kg)    Physical Exam-limited due to televisit. No coughing, conversational dyspnea. Answering questions appropriately.   CBC No results found for: WBC, RBC, HGB, HCT, PLT, MCV, MCH, MCHC, RDW, LYMPHSABS, MONOABS, EOSABS, BASOSABS  CHEMISTRY No results for input(s): NA, K, CL, CO2, GLUCOSE, BUN, CREATININE, CALCIUM, MG, PHOS in the last 168 hours. CrCl cannot be calculated (No successful lab value found.).   Chest Imaging- films reviewed: CXR, 2 view 11/08/2009-normal  Pulmonary Functions Testing Results: No flowsheet data found.     Assessment & Plan:     ICD-10-CM   1. Epistaxis  R04.0   2. Moderate persistent asthma, unspecified whether complicated  J45.40   Epistaxis, likely due to very cold temps recently and resulting low humidity. -recommend using humifdifier until temps rewarm some -stop nasal sprays until nose bleeds stop. After a few days restart them one at a time, flonase first.  Persistent wheezing and shortness of breath persisting after COVID-19 infection.  Suspect this is caused reactivation of her childhood asthma.  Currently having symptoms consistent with moderate persistent asthma. -Continue Advair 500-50 twice daily.   -Continue albuterol every 4h as needed -PFTs scheduled in March -Okay to hold flonase and azelastine for now -Continue levocetirizine and Singulair  RTC in 6 weeks.  12 minutes spent on this encounter, including chart review, time spent on phone with patient, and charting.    Current Outpatient Medications:  .  albuterol (VENTOLIN HFA) 108 (90 Base) MCG/ACT inhaler, Inhale 1 puff into the lungs as needed., Disp: , Rfl:  .  azelastine (ASTELIN) 0.1 % nasal spray, Place 1-2 sprays into both nostrils 2 (two) times daily as needed for rhinitis., Disp: 30 mL, Rfl: 5 .  benzonatate (TESSALON)  100 MG capsule, Take 100 mg by mouth 3  (three) times daily., Disp: , Rfl:  .  cetirizine (ZYRTEC) 10 MG tablet, Take 10 mg by mouth daily., Disp: , Rfl:  .  diphenhydrAMINE (BENADRYL) 25 MG tablet, Take 25 mg by mouth every 6 (six) hours as needed., Disp: , Rfl:  .  famotidine (PEPCID) 20 MG tablet, Take 1 tablet (20 mg total) by mouth 2 (two) times daily as needed for heartburn or indigestion., Disp: 60 tablet, Rfl: 5 .  Fluticasone-Salmeterol (ADVAIR DISKUS) 500-50 MCG/DOSE AEPB, Inhale 1 puff into the lungs 2 (two) times daily., Disp: 60 each, Rfl: 1 .  hydrOXYzine (ATARAX/VISTARIL) 25 MG tablet, Take 1 tablet (25 mg total) by mouth every 6 (six) hours as needed for itching., Disp: 15 tablet, Rfl: 1 .  levocetirizine (XYZAL) 5 MG tablet, Take 1 tablet (5 mg total) by mouth daily as needed for allergies., Disp: 30 tablet, Rfl: 5 .  montelukast (SINGULAIR) 10 MG tablet, , Disp: , Rfl:  .  predniSONE (DELTASONE) 50 MG tablet, Take 1 pill once daily by mouth., Disp: 5 tablet, Rfl: 0 .  SYMBICORT 160-4.5 MCG/ACT inhaler, Inhale 2 puffs into the lungs 2 (two) times daily., Disp: , Rfl:  .  VYVANSE 40 MG CHEW, CSW 1 T PO D, Disp: , Rfl:     Steffanie Dunn, DO [facility] 07/08/2019 7:04 PM

## 2019-07-08 NOTE — Progress Notes (Signed)
Visit changed to televisit. Please see other note from 07/08/19.  Steffanie Dunn, DO 07/23/19 6:00 PM [facility]

## 2019-07-13 ENCOUNTER — Ambulatory Visit: Payer: BC Managed Care – PPO | Admitting: Critical Care Medicine

## 2019-07-13 NOTE — Telephone Encounter (Signed)
Nothing noted in encounter. Will sign off on message.

## 2019-07-28 ENCOUNTER — Telehealth: Payer: Self-pay | Admitting: *Deleted

## 2019-07-28 NOTE — Telephone Encounter (Signed)
Left message that PFT scheduled for 3/22 @ 9 needs to be rescheduled. On 3/8 a message was left re: scheduling covid testing. If patient can be reached we can get covid testing done today or Wed for a 4 pm PFT on Friday.

## 2019-07-29 ENCOUNTER — Telehealth: Payer: Self-pay | Admitting: Critical Care Medicine

## 2019-07-29 NOTE — Telephone Encounter (Signed)
Attempted again today to contact patient on all # Left message on father's # to have pt contact our office.

## 2019-07-29 NOTE — Telephone Encounter (Signed)
LMTCB X 1  Please reschedule PFT that is scheduled for 3/22 @ 9 am  Original message patient calling for a work note so that she can be out of work for [employer] and PFT.

## 2019-07-30 NOTE — Telephone Encounter (Signed)
lmtcb for pt.  

## 2019-07-31 NOTE — Telephone Encounter (Signed)
Spoke with pt, she has an appt on 08/10/2019 but it's at 5:00 pm. Nothing further is needed.

## 2019-08-07 ENCOUNTER — Other Ambulatory Visit (HOSPITAL_COMMUNITY)
Admission: RE | Admit: 2019-08-07 | Discharge: 2019-08-07 | Disposition: A | Discharge status: Home / Self Care | Payer: BC Managed Care – PPO | Source: Ambulatory Visit | Attending: Critical Care Medicine | Admitting: Critical Care Medicine

## 2019-08-07 DIAGNOSIS — Z01812 Encounter for preprocedural laboratory examination: Secondary | ICD-10-CM | POA: Diagnosis not present

## 2019-08-07 DIAGNOSIS — U071 COVID-19: Secondary | ICD-10-CM | POA: Insufficient documentation

## 2019-08-08 ENCOUNTER — Telehealth: Payer: Self-pay | Admitting: Pulmonary Disease

## 2019-08-08 LAB — SARS CORONAVIRUS 2 (TAT 6-24 HRS): SARS Coronavirus 2: POSITIVE — AB

## 2019-08-08 NOTE — Telephone Encounter (Signed)
Received notification by page that this patient's COVID test is positive.  Has upcoming procedure planned for 3/22.

## 2019-08-08 NOTE — Progress Notes (Signed)
3/20/@ 0806- [facility] answering service to notify them that patient had a pre procedural covid test done 3/17, the results were +  0958- still no return call from pulmonology

## 2019-08-10 ENCOUNTER — Telehealth: Payer: Self-pay | Admitting: Critical Care Medicine

## 2019-08-10 NOTE — Telephone Encounter (Signed)
Called and spoke to pt. Pt has been rescheduled for PFT and COVID test for late June. Pt states she needs a work note to excuse her from work from her COVID test day till her PFT. Pt states she is always so careful and is unsure how she wouldve contracted the virus, this is why pt wants to self isolate at home in between the waiting period of covid testing to PFT testing.   Dr. Chestine Spore please advise on letter for work. Thanks.

## 2019-08-10 NOTE — Telephone Encounter (Signed)
Noted. Please see phone note from 3.22.2021. Will sign off.

## 2019-08-10 NOTE — Telephone Encounter (Signed)
Yes, please write her a work note. She should not be going to work with covid.  Steffanie Dunn, DO 08/10/19 3:40 PM [facility]

## 2019-08-11 ENCOUNTER — Encounter: Payer: Self-pay | Admitting: Emergency Medicine

## 2019-08-11 NOTE — Telephone Encounter (Signed)
Letter generated. Called and spoke to pt. Pt aware of letter and states she can pull the letter from MyChart for her employer. Nothing further needed at this time. Will sign off.

## 2019-08-17 ENCOUNTER — Ambulatory Visit: Payer: BC Managed Care – PPO | Attending: Internal Medicine

## 2019-08-17 DIAGNOSIS — Z20822 Contact with and (suspected) exposure to covid-19: Secondary | ICD-10-CM

## 2019-08-18 LAB — SARS-COV-2, NAA 2 DAY TAT

## 2019-08-18 LAB — NOVEL CORONAVIRUS, NAA: SARS-CoV-2, NAA: NOT DETECTED

## 2019-08-20 ENCOUNTER — Telehealth: Payer: Self-pay | Admitting: Critical Care Medicine

## 2019-08-20 NOTE — Telephone Encounter (Signed)
Yes I understand what actually happened my question was simply we know that our policy has changed and that patients who have had Covid will have a positive Covid test for up to 90 days.  I do not believe the PFT should have been canceled. That was my point. Glad she has been rescheduled.   Elisha Headland FNP

## 2019-08-20 NOTE — Telephone Encounter (Signed)
Patient had covid 3 months ago.  Patient was scheduled for PFT 08/10/19, but it was cancelled, because Patient was tested for covid, before her PFT, and the test was positive.  Patient had no symptoms, and thought it was a false positive.  Patient was tested for covid Monday, on her own, and her test results come back yesterday, negative. Patient was rescheduled for PFT 11/13/19, because of positive test results, she received last week.   Message routed to [facility]

## 2019-08-20 NOTE — Telephone Encounter (Signed)
Called and spoke with Patient. Arlys John, NP and Dr. Chestine Spore recommendations given.  Understanding stated. Nothing further at this time.

## 2019-08-20 NOTE — Telephone Encounter (Signed)
I am concerned her recent positive test was a false positive. I agree, PFTs should be done soon.  I agree, she should get her vaccine as soon as she can. No contrindication.  Steffanie Dunn, DO 08/20/19 11:44 AM [facility]

## 2019-08-20 NOTE — Telephone Encounter (Signed)
Pt called back-- please return call.  

## 2019-08-20 NOTE — Telephone Encounter (Signed)
Called and spoke with Jasmine [name]. Jasmine [name] stated she had  Covid 3 months back, with her 5 family members she lives with.  Jasmine [name] stated she was scheduled for a PFT last week, but it was cancelled, because she had a positive covid test.  Jasmine [name] stated she had no symptoms, and was re tested Monday.  Jasmine [name] stated her covid test results came back yesterday, negative.   Jasmine [name] stated she has covid vaccine scheduled at [facility], but was told by PCP, she needed to wait until 90 days to receive vaccine.  Jasmine [name] stated NP at [facility] vaccine site advised Jasmine [name] to keep appointment and get vaccine now. Jasmine [name] stated she is seen by Dr. Chestine Spore, and wanted a pulmonary opinion on receiving vaccine now or in 90 days. Jasmine [name] works with public,and would like vaccine as soon as possible.  Message routed to Arlys John, NP to advise

## 2019-08-20 NOTE — Telephone Encounter (Signed)
ATC Patient.  LM to call back. 

## 2019-08-20 NOTE — Telephone Encounter (Signed)
(  cont'd) vaccine before June. PCP said to ask pulmonary. 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  There was a difference in testing method for second test, it was the really deep nasal swab. Pt lives in close quarters with elderly family and no one else tested positive for covid. Pt thinks test could have been false positive. Please advise. Is she able to get vaccine before 90 day window?

## 2019-08-20 NOTE — Telephone Encounter (Signed)
08/20/2019  Patient does not need to wait 90 days since she had Covid.  Current recommendation and guidelines are waiting 45 days.  She should proceed forward with getting the Covid vaccine as planned.  She should follow the recommendations by the nurse practitioner at the [facility] vaccine site.  Her primary care may be confused regarding the monoclonal antibody infusion which where you do need to wait 90 days to receive.  That is not the case for this patient.  She should proceed forward with obtaining the Covid vaccine as scheduled.  Patient also needs to be rescheduled for pulmonary function test.  I am unsure why this was canceled last week if we know she had a positive Covid test 3 months ago.  This needs to be rescheduled immediately.  We will route as FYI to Dr. Chestine Spore.  Elisha Headland, FNP

## 2019-08-25 ENCOUNTER — Ambulatory Visit: Payer: BC Managed Care – PPO | Admitting: Critical Care Medicine

## 2019-09-02 ENCOUNTER — Ambulatory Visit: Payer: BC Managed Care – PPO | Admitting: Critical Care Medicine

## 2019-09-02 ENCOUNTER — Other Ambulatory Visit: Payer: Self-pay

## 2019-09-02 ENCOUNTER — Encounter: Payer: Self-pay | Admitting: Critical Care Medicine

## 2019-09-02 VITALS — BP 124/60 | HR 100 | Temp 97.3°F | Ht 62.5 in | Wt 168.4 lb

## 2019-09-02 DIAGNOSIS — J4540 Moderate persistent asthma, uncomplicated: Secondary | ICD-10-CM

## 2019-09-02 NOTE — Progress Notes (Signed)
Synopsis: Referred in January 2021 for post-covid by Lewis Moccasin, MD.  Subjective:   PATIENT ID: Jasmine [name] GENDER: female DOB: June 03, 1990, MRN: [id number]  Chief Complaint  Patient presents with  . Follow-up    Patient is feeling much better since last visit. Patient says no cough anymore. Patient wants to talk about getting Covid vaccine.        Jasmine [name] is a 29 y/o woman with a history of moderate persistent asthma who presents for follow up. Since her last visit she has been doing well.  She only has occasional shortness of breath, but she notices it the most when she wears a mask.  She had no trouble wearing a mask prior to catching Covid in the fall.  She continues on Advair, Singulair and all allergy medicines-cetirizine, hydroxyzine, famotidine.  She follows with an allergist.  No issues with heartburn.  She mostly uses her albuterol when she is more active, and there are some days she is not needed at all.  She plans on starting to exercise soon.  She is scheduled for her first Covid vaccine this week.     OV 07/08/19: Jasmine [name] is following up for epistaxis that has been going on for 2 weeks. She is worried it is due to the meds she is on. Due to the colder temps, the heat has been running more, and she has stopped using her humidifier at work. Still on Advair, levocetirizine, montelukast, azelastine, flonase. Cough & SOB both much better. CP is also improved. She completed the prednisone but had increased appetite and did not like taking it.    06/03/19 OV: Jasmine [name] is a 30 y/o woman who presents for evaluation of dyspnea, wheezing, chest pain persistent after having Covid in mid November 2020.  She and her entire family had Covid at the same time, and everyone else's symptoms have fully resolved.  She previously had a severe cough, which has since improved.  She continues to have shortness of breath, which causes wheezing, which  frequently causes her chest pain.  Her chest pain is on the right side along her sternum and she describes as aching.  It worsens when she carries heavy things and resolves with rest.  Overall it has been improving with coughing less.  Overall her symptoms have improved slowly, but have not fully resolved.  Talking exacerbates her symptoms.  He has had more headaches since having Covid.  Her sore throat has resolved.  She was started on Symbicort twice daily and albuterol as needed, which she has been taking every 4-5 hours due to wheezing.  She was given a course of azithromycin in late November, which did not improve her symptoms.  She was prescribed prednisone, but does not think that this was ever picked up from the pharmacy and taken.  She is also taken Tessalon, continued her chronic montelukast for allergies, and has been started on levocetirizine.  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  She has a history of childhood asthma, but has been fine during her teens and 25s.  She inhalers as a child, but is not sure which ones.  She was never hospitalized for asthma as a child. She has a family history of allergies  and one brother.  There is no other family history of asthma or lung disease.  Has not yet had her flu vaccine, but would like to do that today.     Past Medical History:  Diagnosis Date  . Allergic rhinitis   . Anemia   . COVID-19 03/2019  . History of positive PPD 2011  . Hyperlipidemia   . Urticaria      Family History  Problem Relation Age of Onset  . Diabetes Mother   . Hypertension Mother   . Diabetes Father   . Hypertension Father   . Allergic rhinitis Brother   . Angioedema Neg Hx   . Asthma Neg Hx   . Atopy Neg Hx   . Eczema Neg Hx   . Immunodeficiency Neg Hx   .  Urticaria Neg Hx      Past Surgical History:  Procedure Laterality Date  . TONSILLECTOMY      Social History   Socioeconomic History  . Marital status: Single    Spouse name: Not on file  . Number of children: Not on file  . Years of education: Not on file  . Highest education level: Not on file  Occupational History  . 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 Smoking status: Never Smoker  . Smokeless tobacco: Never Used  Substance and Sexual Activity  . Alcohol use: No  . Drug use: No  . Sexual activity: Not on file  Other Topics Concern  . Not on file  Social History Narrative  . Not on file   Social Determinants of Health   Financial Resource Strain:   . Difficulty of Paying Living Expenses:   Food Insecurity:   . Worried About Charity fundraiser in the Last Year:   . Arboriculturist in the Last Year:   Transportation Needs:   . Film/video editor (Medical):   Marland Kitchen Lack of Transportation (Non-Medical):   Physical Activity:   . Days of Exercise per Week:   . Minutes of Exercise per Session:   Stress:   . Feeling of Stress :   Social Connections:   . Frequency of Communication with Friends and Family:   . Frequency of Social Gatherings with Friends and Family:   . Attends Religious Services:   . Active Member of Clubs or Organizations:   . Attends Archivist Meetings:   Marland Kitchen Marital Status:   Intimate Partner Violence:   . Fear of Current or Ex-Partner:   . Emotionally Abused:   Marland Kitchen Physically Abused:   . Sexually Abused:      No Known Allergies   Immunization History  Administered Date(s) Administered  . Influenza,inj,Quad PF,6+ Mos 06/03/2019    Outpatient Medications Prior to Visit  Medication Sig Dispense Refill  . albuterol (VENTOLIN HFA) 108 (90 Base) MCG/ACT inhaler Inhale 1 puff into the lungs as needed.    . ALPRAZolam (XANAX) 0.25 MG tablet Take 0.25-0.5 mg by mouth 2 (two) times daily as needed.    Marland Kitchen azelastine  (ASTELIN) 0.1 % nasal spray Place 1-2 sprays into both nostrils 2 (two) times daily as needed for rhinitis. 30 mL 5  . cetirizine (ZYRTEC) 10 MG tablet Take 10 mg by mouth daily.    . diphenhydrAMINE (BENADRYL) 25 MG tablet Take 25 mg by mouth every 6 (six) hours as needed.    . famotidine (PEPCID) 20 MG tablet Take 1 tablet (20 mg total) by mouth  2 (two) times daily as needed for heartburn or indigestion. 60 tablet 5  . Fluticasone-Salmeterol (ADVAIR DISKUS) 500-50 MCG/DOSE AEPB Inhale 1 puff into the lungs 2 (two) times daily. 60 each 1  . hydrOXYzine (ATARAX/VISTARIL) 25 MG tablet Take 1 tablet (25 mg total) by mouth every 6 (six) hours as needed for itching. 15 tablet 1  . levocetirizine (XYZAL) 5 MG tablet Take 1 tablet (5 mg total) by mouth daily as needed for allergies. 30 tablet 5  . montelukast (SINGULAIR) 10 MG tablet     . sertraline (ZOLOFT) 100 MG tablet Take 100 mg by mouth daily.    Marland Kitchen VYVANSE 40 MG CHEW CSW 1 T PO D    . benzonatate (TESSALON) 100 MG capsule Take 100 mg by mouth 3 (three) times daily.    . predniSONE (DELTASONE) 50 MG tablet Take 1 pill once daily by mouth. 5 tablet 0  . SYMBICORT 160-4.5 MCG/ACT inhaler Inhale 2 puffs into the lungs 2 (two) times daily.     No facility-administered medications prior to visit.    Review of Systems  Constitutional: Negative for fever and weight loss.  HENT: Negative for congestion and sore throat.   Respiratory: Positive for shortness of breath and wheezing. Negative for cough.   Cardiovascular: Positive for chest pain.  Neurological: Positive for headaches.     Objective:   Vitals:   09/02/19 1655  BP: 124/60  Pulse: 100  Temp: (!) 97.3 F (36.3 C)  TempSrc: Temporal  SpO2: 99%  Weight: 168 lb 6.4 oz (76.4 kg)  Height: 5' 2.5" (1.588 m)   99% on   RA BMI Readings from Last 3 Encounters:  09/02/19 30.31 kg/m  06/03/19 31.28 kg/m  03/09/19 32.12 kg/m   Wt Readings from Last 3 Encounters:  09/02/19 168 lb  6.4 oz (76.4 kg)  06/03/19 173 lb 12.8 oz (78.8 kg)  03/09/19 172 lb 12.8 oz (78.4 kg)    Physical Exam Vitals reviewed.  Constitutional:      Appearance: She is not ill-appearing.  HENT:     Head: Normocephalic and atraumatic.  Eyes:     General: No scleral icterus. Cardiovascular:     Rate and Rhythm: Normal rate and regular rhythm.  Pulmonary:     Comments: Breathing comfortably on RA, no conversational dyspnea. CTAB. Minimal cough. Musculoskeletal:        General: No swelling or deformity.     Cervical back: Neck supple.  Skin:    General: Skin is warm and dry.     Findings: No rash.  Neurological:     General: No focal deficit present.     Mental Status: She is alert.     Coordination: Coordination normal.  Psychiatric:        Mood and Affect: Mood normal.        Behavior: Behavior normal.      CBC No results found for: WBC, RBC, HGB, HCT, PLT, MCV, MCH, MCHC, RDW, LYMPHSABS, MONOABS, EOSABS, BASOSABS  CHEMISTRY No results for input(s): NA, K, CL, CO2, GLUCOSE, BUN, CREATININE, CALCIUM, MG, PHOS in the last 168 hours. CrCl cannot be calculated (No successful lab value found.).   Chest Imaging- films reviewed: CXR, 2 view 11/08/2009-normal  Pulmonary Functions Testing Results: No flowsheet data found.     Assessment & Plan:     ICD-10-CM   1. Moderate persistent asthma, unspecified whether complicated  J45.40       Moderate persistent asthma after COVID-19 infection, which likely  caused reactivation of her childhood asthma.   -Continue high-dose Advair twice daily.  Rinse her mouth after every use -Continue albuterol every 4 hours as needed -PFTs have been rescheduled after previous positive Covid test (unsure if this was repeat infection versus false positive as she was having mild nausea and allergy symptoms that week) -Continue allergy meds-cetirizine, Singulair, Pepcid, hydroxyzine per her allergist recommendations -Agree with Covid vaccination.  -Agree with regular physical activity.  Discussed indications to slow down or stop-chest pain, dizziness or lightheadedness. -If PFTs demonstrate uncontrolled asthma, will check eosinophils and IgE to determine if Biologics would be appropriate.  RTC in 2 to 3 months after PFTs.    Current Outpatient Medications:  .  albuterol (VENTOLIN HFA) 108 (90 Base) MCG/ACT inhaler, Inhale 1 puff into the lungs as needed., Disp: , Rfl:  .  ALPRAZolam (XANAX) 0.25 MG tablet, Take 0.25-0.5 mg by mouth 2 (two) times daily as needed., Disp: , Rfl:  .  azelastine (ASTELIN) 0.1 % nasal spray, Place 1-2 sprays into both nostrils 2 (two) times daily as needed for rhinitis., Disp: 30 mL, Rfl: 5 .  cetirizine (ZYRTEC) 10 MG tablet, Take 10 mg by mouth daily., Disp: , Rfl:  .  diphenhydrAMINE (BENADRYL) 25 MG tablet, Take 25 mg by mouth every 6 (six) hours as needed., Disp: , Rfl:  .  famotidine (PEPCID) 20 MG tablet, Take 1 tablet (20 mg total) by mouth 2 (two) times daily as needed for heartburn or indigestion., Disp: 60 tablet, Rfl: 5 .  Fluticasone-Salmeterol (ADVAIR DISKUS) 500-50 MCG/DOSE AEPB, Inhale 1 puff into the lungs 2 (two) times daily., Disp: 60 each, Rfl: 1 .  hydrOXYzine (ATARAX/VISTARIL) 25 MG tablet, Take 1 tablet (25 mg total) by mouth every 6 (six) hours as needed for itching., Disp: 15 tablet, Rfl: 1 .  levocetirizine (XYZAL) 5 MG tablet, Take 1 tablet (5 mg total) by mouth daily as needed for allergies., Disp: 30 tablet, Rfl: 5 .  montelukast (SINGULAIR) 10 MG tablet, , Disp: , Rfl:  .  sertraline (ZOLOFT) 100 MG tablet, Take 100 mg by mouth daily., Disp: , Rfl:  .  VYVANSE 40 MG CHEW, CSW 1 T PO D, Disp: , Rfl:  .  benzonatate (TESSALON) 100 MG capsule, Take 100 mg by mouth 3 (three) times daily., Disp: , Rfl:     Jasmine Dunn, DO [facility] 09/02/2019 5:42 PM

## 2019-09-02 NOTE — Patient Instructions (Addendum)
Thank you for visiting Dr. Chestine Spore at [facility]. We recommend the following:  Keep all meds the same. We will follow up after your PFTs.    Return in about 3 months (around 12/02/2019).    Please do your part to reduce the spread of COVID-19.

## 2019-10-16 ENCOUNTER — Telehealth: Payer: Self-pay | Admitting: Allergy

## 2019-10-16 NOTE — Telephone Encounter (Signed)
Having raised bumps,red, itchy, all over body, this is also affecting her breathing. She did have covid back in nov An this is when the random bumps and breathing started. Only thing that helps them to go away is benadryl or steroids. No new products, no new foods. Not sure what else to try? She is keeping a jourmal but nothing in correlation other then being at work.

## 2019-10-16 NOTE — Telephone Encounter (Signed)
Pt takes xyzal informed her to take that twice a day and informed her to also add in pepcid. Pt stated understanding and has a follow up already scheduled.

## 2019-10-16 NOTE — Telephone Encounter (Signed)
Patient called and states that she is having bad allergy flares and does not know what to do. Patient has been to her PCP and they only prescribe prednisone, but it does not fix the problem. Patient states she thought she was allergic to her cat but that was ruled out with testing and she was told that outside allergies shouldn't be a big concern.  Patient is coming in for an office visit 6/3 and would like to know if she should stop taking the prednisone from her PCP. Patient also would like to know if more allergy testing should be done or if she can do blood work.  Please advise.

## 2019-10-16 NOTE — Telephone Encounter (Signed)
If she is not taking an antihistamine course would recommend that either Zyrtec, Allegra or Xyzal 1-2 tabs a day and would add in Pepcid 20mg  with the antihistamine.   She can finish out the prednisone course as directed by her PCP.  Reviewed her testing from Dr. was all negative to environmental allergens and foods.    We can do blood testing at the visit to determine if she has any environmental allergies to complete testing.

## 2019-10-22 ENCOUNTER — Other Ambulatory Visit: Payer: Self-pay

## 2019-10-22 ENCOUNTER — Encounter: Payer: Self-pay | Admitting: Allergy

## 2019-10-22 ENCOUNTER — Ambulatory Visit: Payer: BC Managed Care – PPO | Admitting: Allergy

## 2019-10-22 VITALS — BP 110/72 | HR 100 | Temp 97.5°F | Resp 16 | Ht 61.5 in | Wt 169.2 lb

## 2019-10-22 DIAGNOSIS — J4540 Moderate persistent asthma, uncomplicated: Secondary | ICD-10-CM

## 2019-10-22 DIAGNOSIS — L508 Other urticaria: Secondary | ICD-10-CM | POA: Diagnosis not present

## 2019-10-22 DIAGNOSIS — J310 Chronic rhinitis: Secondary | ICD-10-CM | POA: Diagnosis not present

## 2019-10-22 NOTE — Progress Notes (Signed)
Follow-up Note  RE: Jasmine [name] MRN: [id number] DOB: 17-Jan-1991 Date of Office Visit: 10/22/2019   History of present illness: Jasmine [name] is a 29 y.o. female presenting today for follow-up of hives.  She was last seen in the office on March 09, 2019 by Dr. Nunzio Cobbs.  She states she had Covid in November and was out of work for about 2-1/2 months due to her illness.  She states she is continued to have shortness of breath with occasional wheeze and chest tightness.  She states her symptoms are so severe at this time that she is using her albuterol about every 4-5 hours most days of the week.  She is on Advair discus 500/50 taking 1 puff twice a day.  She also takes Singulair daily.  She does follow with a pulmonologist and states she is planning to have lung function studies around the end of the month.  She would like to not make any significant changes with her regimen so that she can have more accurate baseline lung function studies done. However she returns today as she has had resurgence of her hives.  She states with her job after she was able to return back to work but she was in a station that was well cleaned and dusted and she was not having any issues.  However week or 2 she moved to a different station that she states was very dusty and she did not start to have hives until she was trying to clean up her workstation.  She did call into the office about a week ago due to her hives.  She did see her PCP for her outbreak of hives and she was prescribed a prednisone pack.  This has helped however she still is continued to have hives.  She is doing Xyzal as well as Pepcid and as above Singulair.  Despite this she is still having some hives.   Review of systems: Review of Systems  Constitutional: Negative.   HENT: Negative.   Eyes: Negative.   Respiratory:       See HPI  Cardiovascular: Negative.   Gastrointestinal: Negative.   Musculoskeletal:  Negative.   Skin: Positive for itching and rash.  Neurological: Negative.     All other systems negative unless noted above in HPI  Past medical/social/surgical/family history have been reviewed and are unchanged unless specifically indicated below.  No changes  Medication List: Current Outpatient Medications  Medication Sig Dispense Refill  . albuterol (VENTOLIN HFA) 108 (90 Base) MCG/ACT inhaler Inhale 1 puff into the lungs as needed.    . ALPRAZolam (XANAX) 0.25 MG tablet Take 0.25-0.5 mg by mouth 2 (two) times daily as needed.    Marland Kitchen azelastine (ASTELIN) 0.1 % nasal spray Place 1-2 sprays into both nostrils 2 (two) times daily as needed for rhinitis. 30 mL 5  . benzonatate (TESSALON) 100 MG capsule Take 100 mg by mouth 3 (three) times daily.    . cetirizine (ZYRTEC) 10 MG tablet Take 10 mg by mouth daily.    . diphenhydrAMINE (BENADRYL) 25 MG tablet Take 25 mg by mouth every 6 (six) hours as needed.    . famotidine (PEPCID) 20 MG tablet Take 1 tablet (20 mg total) by mouth 2 (two) times daily as needed for heartburn or indigestion. 60 tablet 5  . Fluticasone-Salmeterol (ADVAIR DISKUS) 500-50 MCG/DOSE AEPB Inhale 1 puff into the lungs 2 (two) times daily. 60 each 1  . hydrOXYzine (ATARAX/VISTARIL) 25 MG tablet Take  1 tablet (25 mg total) by mouth every 6 (six) hours as needed for itching. 15 tablet 1  . levocetirizine (XYZAL) 5 MG tablet Take 1 tablet (5 mg total) by mouth daily as needed for allergies. 30 tablet 5  . montelukast (SINGULAIR) 10 MG tablet     . sertraline (ZOLOFT) 100 MG tablet Take 100 mg by mouth daily.    Marland Kitchen VYVANSE 40 MG CHEW CSW 1 T PO D     No current facility-administered medications for this visit.     Known medication allergies: No Known Allergies   Physical examination: Blood pressure 110/72, pulse 100, temperature (!) 97.5 F (36.4 C), temperature source Temporal, resp. rate 16, height 5' 1.5" (1.562 m), weight 169 lb 3.2 oz (76.7 kg), SpO2 99  %.  General: Alert, interactive, in no acute distress. HEENT: PERRLA, TMs pearly gray, turbinates non-edematous without discharge, post-pharynx non erythematous. Neck: Supple without lymphadenopathy. Lungs: Clear to auscultation without wheezing, rhonchi or rales. {no increased work of breathing. CV: Normal S1, S2 without murmurs. Abdomen: Nondistended, nontender. Skin: Scattered erythematous urticarial type lesions primarily located b/l forearms , nonvesicular. Extremities:  No clubbing, cyanosis or edema. Neuro:   Grossly intact.  Diagnositics/Labs: None today  Assessment and plan:   Urticaria (hives)  - at this time etiology of hives is unknown however most common etiology is idiopathic or spontaneous.  Hives can be caused by a variety of different triggers including illness/infection, foods, medications, stings, exercise, pressure, vibrations, extremes of temperature to name a few however majority of the time there is no identifiable trigger.  Your symptoms have been ongoing for >6 weeks making this chronic thus will obtain labwork to evaluate: CBC w diff, CMP, tryptase, hive panel, environmental panel, alpha-gal panel, inflammatory markers   - environmental and food allergy skin testing from initial visit was all negative   - for management of hives at this time recommend a high-dose antihistamine regimen as follows: Xyzal 5mg  twice a day, Pepcid 20mg  twice a day and Singulair 10mg  daily  - we discussed Xolair monthly injections today including benefits, risk and protocol.  Xolair helps in management of hives not well controlled with high-dose antihistamine regimen as above  - should symptoms recur, a journal is to be kept recording any foods eaten, beverages consumed, medications taken within a 6 hour period prior to the onset of symptoms, as well as record activities being performed, and environmental conditions.   Nonallergic rhinitis  - Environmental allergy testing was negative at  initial visit  - can use Azelastine nasal spray, 1-2 sprays per nostril 2 times daily as needed for nasal drainage. Proper nasal spray technique has been discussed and demonstrated.  - nasal saline spray (i.e., Simply Saline) or nasal saline lavage (i.e., NeilMed) is recommended as needed and prior to medicated nasal sprays.  - use Xyzal and Singulair as above  Asthma  - history of childhood asthma.  Likely resurgence with Covid illness  - at this time not well controlled  - continue your current regimen as directed by your pulmonologist including Advair, Singulair and as needed albuterol  - I did discuss with her today that she is using albuterol quite often during the week and that she should consider stepping up therapy to Trelegy as Advair is not controlling symptoms well at this time.  She would like to discuss this with her pulmonologist and also would like to perform her lung function studies to get a better "baseline" picture.    -  I also discussed if she is started on Xolair for hive control that this could improve her respiratory symptoms if its potentially driven by IgE/allergens (serum IgE testing is being done today).    Follow-up 2-3 months or sooner if needed   I appreciate the opportunity to take part in Jasmine [name]'s care. Please do not hesitate to contact me with questions.  Sincerely,   Prudy Feeler, MD Allergy/Immunology [facility]

## 2019-10-22 NOTE — Patient Instructions (Addendum)
Urticaria (hives)  - at this time etiology of hives is unknown however most common etiology is idiopathic or spontaneous.  Hives can be caused by a variety of different triggers including illness/infection, foods, medications, stings, exercise, pressure, vibrations, extremes of temperature to name a few however majority of the time there is no identifiable trigger.  Your symptoms have been ongoing for >6 weeks making this chronic thus will obtain labwork to evaluate: CBC w diff, CMP, tryptase, hive panel, environmental panel, alpha-gal panel, inflammatory markers   - environmental and food allergy skin testing from initial visit was all negative   - for management of hives at this time recommend a high-dose antihistamine regimen as follows: Xyzal 5mg  twice a day, Pepcid 20mg  twice a day and Singulair 10mg  daily  - we discussed Xolair monthly injections today including benefits, risk and protocol.  Xolair helps in management of hives not well controlled with high-dose antihistamine regimen as above  - should symptoms recur, a journal is to be kept recording any foods eaten, beverages consumed, medications taken within a 6 hour period prior to the onset of symptoms, as well as record activities being performed, and environmental conditions.   Nonallergic rhinitis  - Environmental allergy testing was negative at initial visit  - can use Azelastine nasal spray, 1-2 sprays per nostril 2 times daily as needed for nasal drainage. Proper nasal spray technique has been discussed and demonstrated.  - nasal saline spray (i.e., Simply Saline) or nasal saline lavage (i.e., NeilMed) is recommended as needed and prior to medicated nasal sprays.  - use Xyzal and Singulair as above  Asthma  - history of childhood asthma.  Likely resurgence with Covid illness  - at this time not well controlled  - continue your current regimen as directed by your pulmonologist including Advair, Singulair and as needed albuterol  - I  did discuss with her today that she is using albuterol quite often during the week and that she should consider stepping up therapy to Trelegy as Advair is not controlling symptoms well at this time.  She would like to discuss this with her pulmonologist and also would like to perform her lung function studies to get a better "baseline" picture.    - I also discussed if she is started on Xolair for hive control that this could improve her respiratory symptoms if its potentially driven by IgE/allergens (serum IgE testing is being done today).    Follow-up 2-3 months or sooner if needed

## 2019-10-28 ENCOUNTER — Telehealth: Payer: Self-pay | Admitting: Allergy

## 2019-10-28 ENCOUNTER — Telehealth: Payer: Self-pay | Admitting: Critical Care Medicine

## 2019-10-28 NOTE — Telephone Encounter (Signed)
Yes all of her labs are not back yet.   If she would like the results of what is back we can provide however will still be waiting on additional lab.

## 2019-10-28 NOTE — Telephone Encounter (Signed)
Patient called and would like you to call her about the labs. Her allergies are really bad, she had to take benadryl and her other allergy meds. [phone].

## 2019-10-28 NOTE — Telephone Encounter (Signed)
That's fine- thanks!  LPC

## 2019-10-28 NOTE — Telephone Encounter (Signed)
Patient returned call and states her allergies are getting worse. She has been itching all over and has been taking benadryl. Patient wanted lab results and I explained to her we will give her a call when all the results are back.

## 2019-10-28 NOTE — Telephone Encounter (Signed)
Left message to discuss about labs

## 2019-10-28 NOTE — Telephone Encounter (Signed)
Left message for patient to call back  

## 2019-10-28 NOTE — Telephone Encounter (Signed)
Called and talked to patient to let her know that she can take her medications as prescribed she just is to not use them the day of her PFT. Patient expressed understanding.  Dr. Chestine Spore just as an FYI patient is currently seeing an allergist due to hives and some skin issues and they are wanting to switch her from Advair to Trelegy & Xolair. Told patient that I would send this to you as an FYI and she wanted to make sure this plan sounded ok to you.

## 2019-10-29 MED ORDER — TRELEGY ELLIPTA 200-62.5-25 MCG/INH IN AEPB: 1.0000 | INHALATION_SPRAY | Freq: Every day | RESPIRATORY_TRACT | 2 refills | Status: DC

## 2019-10-29 NOTE — Telephone Encounter (Signed)
Called and spoke to patient and she states she wanted [place] to send in the Trelegy. Patient also stated that she noticed on her test she was allergic to [facility] and she believes that type of tree is outside of her office and home and anytime she's outside she's itching. Is there something that patient can do or take to help until she's able to get the Xolair injection? She's wanting the Xolair now cause she's itching all over and breaking out. She states the only thing that seems to help is the benadryl but she can't take it while at work because she can not function (drowsy).

## 2019-10-29 NOTE — Addendum Note (Signed)
Addended by: Juliane Poot B on: 10/29/2019 01:53 PM   Modules accepted: Orders

## 2019-10-29 NOTE — Telephone Encounter (Signed)
Ok great.   Still waiting on the autoimmune hive lab to return (please inform this lab does take longer than the others to result back).   Does she want [place] to go ahead and prescribe Trelegy or does she want her pulmonogist to do this?  If she would like [place] to prescribe send in Trelegy 1 puff once a day and she would stop the Advair.   Will cc Tammy to go ahead and start Xolair monthly injections for hives for her.

## 2019-10-29 NOTE — Telephone Encounter (Signed)
Is she taking the Xyzal 5mg  twice a day, Pepcid 20mg  twice a day and Singulair 10mg  daily? Has she noted any improvement in hives on this regimen? Tammy our biologic coordinator will give her a call regarding status of approval for the Xolair.  Will also need to have an epinephrine device for starting Xolair.  If we have any Xolair samples we can offer that to get started.    In regards to her environmental allergy panel yes the [place] tree sensitivity can cause allergy symptoms including itchiness with exposure however we are pretty much out of tree pollen season at his time thus should not causing too much issue.  It is a spring time allergen.

## 2019-10-29 NOTE — Telephone Encounter (Signed)
Patient returned call to office and informed that not all labs were back.  Allergen Zone 2 and CBC results were reviewed with patient.  Patient sent message to pulmonologist and got okay to start Trelegy and Xolair. She is interested in starting both.  Informed patient we would contact after remaining labs are back and resulted and will let Dr. Delorse Lek know she is interested in starting Trelegy and Xolair.

## 2019-10-29 NOTE — Telephone Encounter (Signed)
Left voicemail for patient to return call.

## 2019-10-30 LAB — COMPREHENSIVE METABOLIC PANEL
ALT: 17 IU/L (ref 0–32)
AST: 11 IU/L (ref 0–40)
Albumin/Globulin Ratio: 1.9 (ref 1.2–2.2)
Albumin: 4.5 g/dL (ref 3.9–5.0)
Alkaline Phosphatase: 106 IU/L (ref 48–121)
BUN/Creatinine Ratio: 21 (ref 9–23)
BUN: 13 mg/dL (ref 6–20)
Bilirubin Total: 0.3 mg/dL (ref 0.0–1.2)
CO2: 22 mmol/L (ref 20–29)
Calcium: 9.7 mg/dL (ref 8.7–10.2)
Chloride: 93 mmol/L — ABNORMAL LOW (ref 96–106)
Creatinine, Ser: 0.62 mg/dL (ref 0.57–1.00)
GFR calc Af Amer: 142 mL/min/{1.73_m2} (ref >59)
GFR calc non Af Amer: 123 mL/min/{1.73_m2} (ref >59)
Globulin, Total: 2.4 g/dL (ref 1.5–4.5)
Glucose: 124 mg/dL — ABNORMAL HIGH (ref 65–99)
Potassium: 4.3 mmol/L (ref 3.5–5.2)
Sodium: 137 mmol/L (ref 134–144)
Total Protein: 6.9 g/dL (ref 6.0–8.5)

## 2019-10-30 LAB — CBC WITH DIFFERENTIAL
Basophils Absolute: 0.1 10*3/uL (ref 0.0–0.2)
Basos: 1 %
EOS (ABSOLUTE): 0 10*3/uL (ref 0.0–0.4)
Eos: 0 %
Hematocrit: 42.7 % (ref 34.0–46.6)
Hemoglobin: 14.6 g/dL (ref 11.1–15.9)
Immature Grans (Abs): 0.1 10*3/uL (ref 0.0–0.1)
Immature Granulocytes: 1 %
Lymphocytes Absolute: 3.2 10*3/uL — ABNORMAL HIGH (ref 0.7–3.1)
Lymphs: 35 %
MCH: 31.3 pg (ref 26.6–33.0)
MCHC: 34.2 g/dL (ref 31.5–35.7)
MCV: 92 fL (ref 79–97)
Monocytes Absolute: 0.5 10*3/uL (ref 0.1–0.9)
Monocytes: 5 %
Neutrophils Absolute: 5.4 10*3/uL (ref 1.4–7.0)
Neutrophils: 58 %
RBC: 4.66 x10E6/uL (ref 3.77–5.28)
RDW: 13.3 % (ref 11.7–15.4)
WBC: 9.1 10*3/uL (ref 3.4–10.8)

## 2019-10-30 LAB — ALLERGENS, ZONE 2

## 2019-10-30 LAB — THYROID ANTIBODIES
Thyroglobulin Antibody: <1 IU/mL (ref 0.0–0.9)
Thyroperoxidase Ab SerPl-aCnc: <9 IU/mL (ref 0–34)

## 2019-10-30 LAB — TRYPTASE: Tryptase: 3.8 ug/L (ref 2.2–13.2)

## 2019-10-30 LAB — TSH: TSH: 2.2 u[IU]/mL (ref 0.450–4.500)

## 2019-10-30 LAB — CHRONIC URTICARIA: cu index: <1.1 (ref <10)

## 2019-10-30 LAB — ANA W/REFLEX: Anti Nuclear Antibody (ANA): NEGATIVE

## 2019-10-30 LAB — SEDIMENTATION RATE: Sed Rate: 60 mm/hr — ABNORMAL HIGH (ref 0–32)

## 2019-11-02 NOTE — Telephone Encounter (Signed)
Patient was notify of Biologics and as well as starting Allergy injection. Patient made aware of possible starting Immunotherapy to trees. Dr. Delorse Lek please advise.

## 2019-11-02 NOTE — Telephone Encounter (Signed)
L/m for patient to return my call.  I have her approval and copay card and she is going to be buy and bill so I can just set her up to start therapy

## 2019-11-03 NOTE — Telephone Encounter (Signed)
Randie Heinz will have her start Xolair as soon as she returns call to [facility].  I have not discussed allergen immunotherapy and at this time biggest concern is getting hives under control thus will get Xolair on board.

## 2019-11-04 DIAGNOSIS — L501 Idiopathic urticaria: Secondary | ICD-10-CM | POA: Diagnosis not present

## 2019-11-05 ENCOUNTER — Ambulatory Visit (INDEPENDENT_AMBULATORY_CARE_PROVIDER_SITE_OTHER): Payer: BC Managed Care – PPO

## 2019-11-05 ENCOUNTER — Other Ambulatory Visit: Payer: Self-pay

## 2019-11-05 DIAGNOSIS — L508 Other urticaria: Secondary | ICD-10-CM

## 2019-11-05 DIAGNOSIS — L501 Idiopathic urticaria: Secondary | ICD-10-CM | POA: Diagnosis not present

## 2019-11-05 MED ORDER — OMALIZUMAB 150 MG ~~LOC~~ SOLR
300.0000 mg | SUBCUTANEOUS | Status: DC
  Administered 2019-11-05: 300 mg via SUBCUTANEOUS

## 2019-11-05 MED ORDER — EPINEPHRINE 0.3 MG/0.3ML IJ SOAJ: 0.3000 mg | Freq: Once | INTRAMUSCULAR | 1 refills | Status: DC | PRN

## 2019-11-05 NOTE — Telephone Encounter (Signed)
Spoke to patient and advised buy and bill and copay card obtained. Patient appt made for this am to start therapy

## 2019-11-05 NOTE — Progress Notes (Signed)
Immunotherapy   Patient Details  Name: Lanell Carpenter MRN: [id number] Date of Birth: 1990-11-25  11/05/2019  Adaline Haydar Zakrzewski started injections for  Xolair 300 mg every 28 days  Epi-Pen:Prescription for Epi-Pen given  Patient waited 30 minutes in office post injection with no local or systemic reactions noted.  Consent signed and patient instructions given.   Exie Parody 11/05/2019, 11:34 AM

## 2019-11-10 ENCOUNTER — Other Ambulatory Visit (HOSPITAL_COMMUNITY)
Admission: RE | Admit: 2019-11-10 | Discharge: 2019-11-10 | Disposition: A | Discharge status: Home / Self Care | Payer: BC Managed Care – PPO | Source: Ambulatory Visit | Attending: Critical Care Medicine | Admitting: Critical Care Medicine

## 2019-11-10 DIAGNOSIS — Z20822 Contact with and (suspected) exposure to covid-19: Secondary | ICD-10-CM | POA: Insufficient documentation

## 2019-11-10 DIAGNOSIS — Z01812 Encounter for preprocedural laboratory examination: Secondary | ICD-10-CM | POA: Diagnosis not present

## 2019-11-10 LAB — SARS CORONAVIRUS 2 (TAT 6-24 HRS): SARS Coronavirus 2: NEGATIVE

## 2019-11-13 ENCOUNTER — Ambulatory Visit (INDEPENDENT_AMBULATORY_CARE_PROVIDER_SITE_OTHER): Payer: BC Managed Care – PPO | Admitting: Critical Care Medicine

## 2019-11-13 ENCOUNTER — Other Ambulatory Visit: Payer: Self-pay

## 2019-11-13 DIAGNOSIS — R062 Wheezing: Secondary | ICD-10-CM

## 2019-11-13 DIAGNOSIS — Z8616 Personal history of COVID-19: Secondary | ICD-10-CM | POA: Diagnosis not present

## 2019-11-13 DIAGNOSIS — J4540 Moderate persistent asthma, uncomplicated: Secondary | ICD-10-CM

## 2019-11-13 LAB — PULMONARY FUNCTION TEST
DL/VA % pred: 122 %
DL/VA: 5.77 ml/min/mmHg/L
DLCO cor % pred: 102 %
DLCO cor: 21.43 ml/min/mmHg
DLCO unc % pred: 105 %
DLCO unc: 22.18 ml/min/mmHg
FEF 25-75 Post: 2.88 L/sec
FEF 25-75 Pre: 2.84 L/sec
FEF2575-%Change-Post: 1 %
FEF2575-%Pred-Post: 84 %
FEF2575-%Pred-Pre: 83 %
FEV1-%Change-Post: 0 %
FEV1-%Pred-Post: 86 %
FEV1-%Pred-Pre: 87 %
FEV1-Post: 2.62 L
FEV1-Pre: 2.65 L
FEV1FVC-%Change-Post: 1 %
FEV1FVC-%Pred-Pre: 100 %
FEV6-%Change-Post: -2 %
FEV6-%Pred-Post: 85 %
FEV6-%Pred-Pre: 87 %
FEV6-Post: 3.01 L
FEV6-Pre: 3.1 L
FEV6FVC-%Pred-Post: 100 %
FEV6FVC-%Pred-Pre: 100 %
FVC-%Change-Post: -2 %
FVC-%Pred-Post: 85 %
FVC-%Pred-Pre: 87 %
FVC-Post: 3.01 L
FVC-Pre: 3.1 L
Post FEV1/FVC ratio: 87 %
Post FEV6/FVC ratio: 100 %
Pre FEV1/FVC ratio: 85 %
Pre FEV6/FVC Ratio: 100 %
RV % pred: 16 %
RV: 0.21 L
TLC % pred: 82 %
TLC: 3.92 L

## 2019-11-13 NOTE — Progress Notes (Signed)
Please let Jasmine [name] know that her PFTs show that her asthma is under good control. If she is having coughing still it may be due to allergies. Her allergist Dr. Delorse Lek has been copied to this test result.  LPC

## 2019-11-13 NOTE — Progress Notes (Signed)
Full PFT preformed today  

## 2019-11-15 ENCOUNTER — Emergency Department (HOSPITAL_COMMUNITY)
Admission: EM | Admit: 2019-11-15 | Discharge: 2019-11-15 | Disposition: A | Discharge status: Home / Self Care | Payer: BC Managed Care – PPO | Attending: Emergency Medicine | Admitting: Emergency Medicine

## 2019-11-15 ENCOUNTER — Other Ambulatory Visit: Payer: Self-pay

## 2019-11-15 ENCOUNTER — Emergency Department (HOSPITAL_COMMUNITY): Payer: BC Managed Care – PPO

## 2019-11-15 ENCOUNTER — Encounter (HOSPITAL_COMMUNITY): Payer: Self-pay

## 2019-11-15 DIAGNOSIS — R011 Cardiac murmur, unspecified: Secondary | ICD-10-CM | POA: Diagnosis not present

## 2019-11-15 DIAGNOSIS — R569 Unspecified convulsions: Secondary | ICD-10-CM | POA: Insufficient documentation

## 2019-11-15 DIAGNOSIS — E876 Hypokalemia: Secondary | ICD-10-CM

## 2019-11-15 DIAGNOSIS — Z8616 Personal history of COVID-19: Secondary | ICD-10-CM | POA: Insufficient documentation

## 2019-11-15 LAB — CBC WITH DIFFERENTIAL/PLATELET
Abs Immature Granulocytes: 0.05 10*3/uL (ref 0.00–0.07)
Basophils Absolute: 0.1 10*3/uL (ref 0.0–0.1)
Basophils Relative: 1 %
Eosinophils Absolute: 0 10*3/uL (ref 0.0–0.5)
Eosinophils Relative: 0 %
HCT: 46.7 % — ABNORMAL HIGH (ref 36.0–46.0)
Hemoglobin: 15 g/dL (ref 12.0–15.0)
Immature Granulocytes: 1 %
Lymphocytes Relative: 18 %
Lymphs Abs: 1.7 10*3/uL (ref 0.7–4.0)
MCH: 31 pg (ref 26.0–34.0)
MCHC: 32.1 g/dL (ref 30.0–36.0)
MCV: 96.5 fL (ref 80.0–100.0)
Monocytes Absolute: 0.5 10*3/uL (ref 0.1–1.0)
Monocytes Relative: 5 %
Neutro Abs: 7.4 10*3/uL (ref 1.7–7.7)
Neutrophils Relative %: 75 %
Platelets: 316 10*3/uL (ref 150–400)
RBC: 4.84 MIL/uL (ref 3.87–5.11)
RDW: 13.4 % (ref 11.5–15.5)
WBC: 9.8 10*3/uL (ref 4.0–10.5)
nRBC: 0 % (ref 0.0–0.2)

## 2019-11-15 LAB — COMPREHENSIVE METABOLIC PANEL
ALT: 14 U/L (ref 0–44)
AST: 23 U/L (ref 15–41)
Albumin: 4.2 g/dL (ref 3.5–5.0)
Alkaline Phosphatase: 82 U/L (ref 38–126)
Anion gap: 16 — ABNORMAL HIGH (ref 5–15)
BUN: 10 mg/dL (ref 6–20)
CO2: 22 mmol/L (ref 22–32)
Calcium: 9.5 mg/dL (ref 8.9–10.3)
Chloride: 100 mmol/L (ref 98–111)
Creatinine, Ser: 0.92 mg/dL (ref 0.44–1.00)
GFR calc Af Amer: >60 mL/min (ref >60)
GFR calc non Af Amer: >60 mL/min (ref >60)
Glucose, Bld: 120 mg/dL — ABNORMAL HIGH (ref 70–99)
Potassium: 3.2 mmol/L — ABNORMAL LOW (ref 3.5–5.1)
Sodium: 138 mmol/L (ref 135–145)
Total Bilirubin: 0.6 mg/dL (ref 0.3–1.2)
Total Protein: 7.1 g/dL (ref 6.5–8.1)

## 2019-11-15 LAB — HCG, QUANTITATIVE, PREGNANCY: hCG, Beta Chain, Quant, S: <1 m[IU]/mL (ref <5)

## 2019-11-15 LAB — MAGNESIUM: Magnesium: 2.1 mg/dL (ref 1.7–2.4)

## 2019-11-15 MED ORDER — ACETAMINOPHEN 500 MG PO TABS
1000.0000 mg | ORAL_TABLET | Freq: Once | ORAL | Status: AC
  Administered 2019-11-15: 1000 mg via ORAL
  Filled 2019-11-15: qty 2

## 2019-11-15 MED ORDER — POTASSIUM CHLORIDE CRYS ER 20 MEQ PO TBCR
80.0000 meq | EXTENDED_RELEASE_TABLET | Freq: Once | ORAL | Status: AC
  Administered 2019-11-15: 80 meq via ORAL
  Filled 2019-11-15: qty 4

## 2019-11-15 MED ORDER — LACTATED RINGERS IV BOLUS
1000.0000 mL | Freq: Once | INTRAVENOUS | Status: AC
  Administered 2019-11-15: 1000 mL via INTRAVENOUS

## 2019-11-15 MED ORDER — POTASSIUM CHLORIDE CRYS ER 20 MEQ PO TBCR
20.0000 meq | EXTENDED_RELEASE_TABLET | Freq: Two times a day (BID) | ORAL | 0 refills | Status: DC
Start: 2019-11-15 — End: 2019-12-09

## 2019-11-15 NOTE — ED Triage Notes (Signed)
Pt arrived w/ c/o/ seizure witnessed for approx 1-26mins; family reports patient has not been eating or drinking and under stress due to an upcoming wedding; family states pt became altered not following commands while in wedding rehearsal. Pt currently takes Vyvanse. VS 142/96;HR 116-130; 98% on RA; CBG 115; EMS reports dilated pupils; diaphoretic; and confused

## 2019-11-15 NOTE — ED Notes (Signed)
Pt verbalized understanding of d/c instructions, follow up carea nd s/s requiring return to ed. Pt had no further questions and ambulated to exit.  

## 2019-11-15 NOTE — ED Provider Notes (Signed)
Emergency Department Provider Note  I have reviewed the triage vital signs and the nursing notes.  HISTORY  Chief Complaint Seizures   HPI Jasmine [name] is a 29 y.o. female who presents via EMS secondary to episode of altered mental status.  Patient was at a wedding when she was very stressed and anxious.  She states that she was working on practicing for the perceptional when she felt very confused.  She states that she was awake and aware of her surroundings and what she was trying to do but she could not remember the cadence and the steps that she was supposed to take.  She is having trouble following the person in front of her.  There was not period of time she does not remember and then there was a period of time with where she was confused and aware of her surroundings and the people talking to her and with her asking if she was trying to answer that she was having difficulty doing.  EMS was called and reportedly her vital signs and glucose were all normal she is brought here for further evaluation.  Normal recent intake except for some extra caffeine today.  Once again states that she has been having a lot of stress and anxiety recently as well. no h/o syncope or seizures.   No other associated or modifying symptoms.    Past Medical History:  Diagnosis Date  . Allergic rhinitis   . Anemia   . COVID-19 03/2019  . History of positive PPD 2011  . Hyperlipidemia   . Urticaria     Patient Active Problem List   Diagnosis Date Noted  . Urticaria 03/09/2019  . Nonallergic rhinitis 03/09/2019  . Allergic reaction 03/09/2019    Past Surgical History:  Procedure Laterality Date  . TONSILLECTOMY      Current Outpatient Rx  . Order #: [id number] Class: Historical Med  . Order #: [id number] Class: Normal  . Order #: [id number] Class: Normal  . Order #: [id number] Class: Historical Med  . Order #: [id number] Class: Historical Med  . Order #: [id number] Class: Historical Med    . Order #: [id number] Class: Historical Med  . Order #: [id number] Class: Print    Allergies Pollen extract-tree extract [pollen extract] and Shagbark hickory  Family History  Problem Relation Age of Onset  . Diabetes Mother   . Hypertension Mother   . Diabetes Father   . Hypertension Father   . Allergic rhinitis Brother   . Angioedema Neg Hx   . Asthma Neg Hx   . Atopy Neg Hx   . Eczema Neg Hx   . Immunodeficiency Neg Hx   . Urticaria Neg Hx     Social History Social History   Tobacco Use  . Smoking status: Never Smoker  . Smokeless tobacco: Never Used  Vaping Use  . Vaping Use: Never used  Substance Use Topics  . Alcohol use: No  . Drug use: No    Review of Systems  All other systems negative except as documented in the HPI. All pertinent positives and negatives as reviewed in the HPI. ____________________________________________  PHYSICAL EXAM:  VITAL SIGNS: Vitals:   11/15/19 0357 11/15/19 0413  Pulse: 86 91  Resp: 20 18  SpO2: 100% 100%     Constitutional: Alert and oriented. Well appearing and in no acute distress. Eyes: Conjunctivae are normal. PERRL. EOMI. Head: Atraumatic. Nose: No congestion/rhinnorhea. Mouth/Throat: Mucous membranes are moist.  Oropharynx non-erythematous. Neck: No stridor.  No meningeal signs.   Cardiovascular:  Normal rate, regular rhythm. Good peripheral circulation. Soft systolic murmur present. Respiratory: Normal respiratory effort.  No retractions. Lungs CTAB. Gastrointestinal: Soft and nontender. No distention.  Musculoskeletal: No lower extremity tenderness nor edema. No gross deformities of extremities. Neurologic:  No altered mental status, able to give full seemingly accurate history.  Face is symmetric, EOM's intact, pupils equal and reactive, vision intact, tongue and uvula midline without deviation. Upper and Lower extremity motor 5/5, intact pain perception in distal extremities, 2+ reflexes in biceps, patella and  achilles tendons. Able to perform finger to nose normal with both hands. Walks without assistance or evident ataxia.   Skin:  Skin is warm, dry and intact. No rash noted. ____________________________________________   LABS (all labs ordered are listed, but only abnormal results are displayed)  Labs Reviewed  CBC WITH DIFFERENTIAL/PLATELET - Abnormal; Notable for the following components:      Result Value   HCT 46.7 (*)    All other components within normal limits  COMPREHENSIVE METABOLIC PANEL - Abnormal; Notable for the following components:   Potassium 3.2 (*)    Glucose, Bld 120 (*)    Anion gap 16 (*)    All other components within normal limits  MAGNESIUM  HCG, QUANTITATIVE, PREGNANCY   ____________________________________________  EKG   EKG Interpretation  Date/Time:    Ventricular Rate:    PR Interval:    QRS Duration:   QT Interval:    QTC Calculation:   R Axis:     Text Interpretation:         ____________________________________________  RADIOLOGY  DG Chest Portable 1 View  Result Date: 11/15/2019 CLINICAL DATA:  Seizures EXAM: PORTABLE CHEST 1 VIEW COMPARISON:  11/08/2009 FINDINGS: Low volumes and streaky opacities in the bases favoring atelectatic change. No focal consolidative opacity, convincing features of edema, pneumothorax or effusion. The cardiomediastinal contours are unremarkable. No acute osseous or soft tissue abnormality. Telemetry leads overlie the chest. IMPRESSION: Low volumes and streaky opacities in the bases favoring atelectatic change. No other acute cardiopulmonary abnormality. Electronically Signed   By: Lovena Le M.D.   On: 11/15/2019 01:55   ____________________________________________  PROCEDURES  Procedure(s) performed:   Procedures ____________________________________________  INITIAL IMPRESSION / ASSESSMENT AND PLAN / ED COURSE   This patient presents to the ED for concern of altered mental status, this involves an  extensive number of treatment options, and is a complaint that carries with it a high risk of complications and morbidity.  The differential diagnosis includes conversion disorder, stress reaction, syncope, seizure or cardiac cause or electrolyte.     Lab Tests:   I Ordered, reviewed, and interpreted labs, which included cbc/bmp with slightly low K. repleted   Medicines ordered:   I ordered medication K  For hypo-k  Imaging Studies ordered:   I independently visualized and interpreted imaging cxr which showed no significant abnormalities  Additional history obtained:   Additional history obtained from brother/EMS  Previous records obtained and reviewed none  Consultations Obtained:   I consulted noone  and discussed lab and imaging findings  Reevaluation:  After the interventions stated above, I reevaluated the patient and found felt better, no other issues. Stable for discharge.   A medical screening exam was performed and I feel the patient has had an appropriate workup for their chief complaint at this time and likelihood of emergent condition existing is low. They have been counseled on decision, discharge, follow up and which symptoms necessitate immediate return to the emergency department.  They or their family verbally stated understanding and agreement with plan and discharged in stable condition.   ____________________________________________  FINAL CLINICAL IMPRESSION(S) / ED DIAGNOSES  Final diagnoses:  Seizure-like activity (HCC)  Hypokalemia  Heart murmur    MEDICATIONS GIVEN DURING THIS VISIT:  Medications  lactated ringers bolus 1,000 mL (0 mLs Intravenous Stopped 11/15/19 0415)  potassium chloride SA (KLOR-CON) CR tablet 80 mEq (80 mEq Oral Given 11/15/19 0419)  acetaminophen (TYLENOL) tablet 1,000 mg (1,000 mg Oral Given 11/15/19 0426)    NEW OUTPATIENT MEDICATIONS STARTED DURING THIS VISIT:  Discharge Medication List as of 11/15/2019  4:08 AM      START taking these medications   Details  potassium chloride SA (KLOR-CON) 20 MEQ tablet Take 1 tablet (20 mEq total) by mouth 2 (two) times daily for 10 days., Starting Sun 11/15/2019, Until Wed 11/25/2019, Print        Note:  This note was prepared with assistance of Dragon voice recognition software. Occasional wrong-word or sound-a-like substitutions may have occurred due to the inherent limitations of voice recognition software.   Elynore Dolinski, Barbara Cower, MD 11/15/19 [phone]

## 2019-11-17 NOTE — Progress Notes (Signed)
Cardiology Office Note:   Date:  11/18/2019  NAME:  Jasmine [name]    MRN: [id number] DOB:  Mar 02, 1991   PCP:  Lewis Moccasin, MD  Cardiologist:  No primary care provider on file.   Referring MD: Lewis Moccasin, MD   Chief Complaint  Patient presents with  . Heart Murmur   History of Present Illness:   Jasmine [name] is a 29 y.o. female with a hx of allergies who is being seen today for the evaluation of murmur at the request of Lewis Moccasin, MD. She was evaluated in the emergency room on November 15, 2019 for seizure-like activity. She was then referred to cardiology for possible murmur.  She reports she was at a wedding rehearsal on Saturday.  Apparently it was hot and she had not eaten or had much to drink that day.  She reports while walking down the aisle in the bridal party she became sweaty and dizzy.  She reports she blacked out.  She reports that she remembers her friends waking her up but she was awake but unable to answer questions appropriately.  There was concerns for possible seizure per reports.  She was then back to normal in a few minutes.  She reports no urination or defecation on her self.  She denies any chest pain or shortness of breath before or after the event.  She reports she did not feel her heart racing either.  She reports has been extremely nervous and stressed about the wedding and this also possibly played into this.  She is now concerned she possibly has a neurological disorder.  She has no significant medical history other than allergies.  She apparently took Xolair injection for allergies a few days prior to the event.  She has no history of diabetes or hypertension.  No recent medication changes other than the injection of Xolair.  She was then evaluated emergency room and informed she had a murmur.  She was sent to [place].  She had no further syncopal events.  Thyroid studies were normal by her primary care physician.  These  were done on June 3.  She describes no limitations with her current level activity.  She is a never smoker does not consume alcohol.  No illicit drug use reported.  Her EKG today is normal sinus rhythm with no evidence of ischemia or prior infarction.  Past Medical History: Past Medical History:  Diagnosis Date  . Allergic rhinitis   . Anemia   . COVID-19 03/2019  . History of positive PPD 2011  . Hyperlipidemia   . Urticaria     Past Surgical History: Past Surgical History:  Procedure Laterality Date  . TONSILLECTOMY      Current Medications: Current Meds  Medication Sig  . ALPRAZolam (XANAX) 0.25 MG tablet Take by mouth.  . cetirizine (ZYRTEC) 10 MG tablet Take 10 mg by mouth daily.  Marland Kitchen EPINEPHrine (AUVI-Q) 0.3 mg/0.3 mL IJ SOAJ injection Inject 0.3 mLs (0.3 mg total) into the muscle once as needed for anaphylaxis.  Marland Kitchen Fluticasone-Umeclidin-Vilant (TRELEGY ELLIPTA) 200-62.5-25 MCG/INH AEPB Inhale 1 puff into the lungs daily.  . montelukast (SINGULAIR) 10 MG tablet Take 10 mg by mouth at bedtime.   . potassium chloride SA (KLOR-CON) 20 MEQ tablet Take 1 tablet (20 mEq total) by mouth 2 (two) times daily for 10 days.  Marland Kitchen sertraline (ZOLOFT) 100 MG tablet Take 100 mg by mouth daily.  Marland Kitchen VYVANSE 20 MG CHEW Chew 20 mg by  mouth daily as needed (needed for energy).   Marland Kitchen VYVANSE 40 MG CHEW Chew 40 mg by mouth daily.    Current Facility-Administered Medications for the 11/18/19 encounter (Office Visit) with Sande Rives, MD  Medication  . omalizumab Geoffry Paradise) injection 300 mg     Allergies:    Pollen extract-tree extract [pollen extract] and Shagbark hickory   Social History: Social History   Socioeconomic History  . Marital status: Single    Spouse name: Not on file  . Number of children: Not on file  . Years of education: Not on file  . Highest education level: Not on file  Occupational History  . Occupation: Fish farm manager: [employer]  Tobacco Use  . Smoking  status: Never Smoker  . Smokeless tobacco: Never Used  Vaping Use  . Vaping Use: Never used  Substance and Sexual Activity  . Alcohol use: No  . Drug use: No  . Sexual activity: Not on file  Other Topics Concern  . Not on file  Social History Narrative  . Not on file   Social Determinants of Health   Financial Resource Strain:   . Difficulty of Paying Living Expenses:   Food Insecurity:   . Worried About Programme researcher, broadcasting/film/video in the Last Year:   . Barista in the Last Year:   Transportation Needs:   . Freight forwarder (Medical):   Marland Kitchen Lack of Transportation (Non-Medical):   Physical Activity:   . Days of Exercise per Week:   . Minutes of Exercise per Session:   Stress:   . Feeling of Stress :   Social Connections:   . Frequency of Communication with Friends and Family:   . Frequency of Social Gatherings with Friends and Family:   . Attends Religious Services:   . Active Member of Clubs or Organizations:   . Attends Banker Meetings:   Marland Kitchen Marital Status:      Family History: The patient's family history includes Allergic rhinitis in her brother; Diabetes in her father and mother; Hypertension in her father and mother. There is no history of Angioedema, Asthma, Atopy, Eczema, Immunodeficiency, or Urticaria.  ROS:   All other ROS reviewed and negative. Pertinent positives noted in the HPI.     EKGs/Labs/Other Studies Reviewed:   The following studies were personally reviewed by me today:  EKG:  EKG is ordered today.  The ekg ordered today demonstrates normal sinus rhythm, heart 100, no acute ST-T changes, no evidence of prior infarct, and was personally reviewed by me.   Recent Labs: 10/22/2019: TSH 2.200 11/15/2019: ALT 14; BUN 10; Creatinine, Ser 0.92; Hemoglobin 15.0; Magnesium 2.1; Platelets 316; Potassium 3.2; Sodium 138   Recent Lipid Panel No results found for: CHOL, TRIG, HDL, CHOLHDL, VLDL, LDLCALC, LDLDIRECT  Physical Exam:   VS:  BP  124/78   Pulse 100   Temp (!) 96.9 F (36.1 C)   Ht 5\' 2"  (1.575 m)   Wt 168 lb (76.2 kg)   SpO2 98%   BMI 30.73 kg/m    Wt Readings from Last 3 Encounters:  11/18/19 168 lb (76.2 kg)  10/22/19 169 lb 3.2 oz (76.7 kg)  09/02/19 168 lb 6.4 oz (76.4 kg)    General: Well nourished, well developed, in no acute distress Heart: Atraumatic, normal size  Eyes: PEERLA, EOMI  Neck: Supple, no JVD Endocrine: No thryomegaly Cardiac: Normal S1, S2; faint systolic ejection murmur Lungs: Clear to auscultation  bilaterally, no wheezing, rhonchi or rales  Abd: Soft, nontender, no hepatomegaly  Ext: No edema, pulses 2+ Musculoskeletal: No deformities, BUE and BLE strength normal and equal Skin: Warm and dry, no rashes   Neuro: Alert and oriented to person, place, time, and situation, CNII-XII grossly intact, no focal deficits  Psych: Normal mood and affect   ASSESSMENT:   Jasmine [name] is a 29 y.o. female who presents for the following: 1. Murmur   2. Syncope and collapse     PLAN:   1. Murmur -Faint systolic ejection murmur.  Suspect this is benign.  I suspect her murmur was worse and she was likely dehydrated the emergency room.  Likely this will not be any issue for her.  We will obtain an echocardiogram just to further reassure her and her care physician that she does not have any cardiovascular disease.  2. Syncope and collapse -The story is classic for vasovagal syncope.  She felt hot warm on a hot day and had not had much to eat or drink that day.  She did not defecate herself or urinate on herself.  She has no history of seizure disorder.  I have encouraged her to keep her appointment with neurology just to make sure there is nothing else going on but I find this unlikely given the circumstances that she actually had a seizure.  Her EKG demonstrates normal sinus rhythm with no acute ST-T changes or evidence of prior infarction.  She has no strong evidence of  cardiovascular disease on examination.  I do not feel that this was a primary cardiac problem.  She had no chest pain or shortness of breath prior to the event.  I have encouraged her to remain well-hydrated and to get an echocardiogram just to evaluate her soft murmur.  However I do not suspect there will be any significant cardiac pathology here.  She will see [place] as needed.  Disposition: Return if symptoms worsen or fail to improve.  Medication Adjustments/Labs and Tests Ordered: Current medicines are reviewed at length with the patient today.  Concerns regarding medicines are outlined above.  Orders Placed This Encounter  Procedures  . EKG 12-Lead  . ECHOCARDIOGRAM COMPLETE   No orders of the defined types were placed in this encounter.   Patient Instructions  Medication Instructions:  The current medical regimen is effective;  continue present plan and medications.  *If you need a refill on your cardiac medications before your next appointment, please call your pharmacy*   Testing/Procedures: Echocardiogram - Your physician has requested that you have an echocardiogram. Echocardiography is a painless test that uses sound waves to create images of your heart. It provides your doctor with information about the size and shape of your heart and how well your heart's chambers and valves are working. This procedure takes approximately one hour. There are no restrictions for this procedure. This will be performed at our [facility] location - [address].    Follow-Up: At [facility], you and your health needs are our priority.  As part of our continuing mission to provide you with exceptional heart care, we have created designated Provider Care Teams.  These Care Teams include your primary Cardiologist (physician) and Advanced Practice Providers (APPs -  Physician Assistants and Nurse Practitioners) who all work together to provide you with the care you need, when you need  it.  We recommend signing up for the patient portal called "MyChart".  Sign up information  is provided on this After Visit Summary.  MyChart is used to connect with patients for Virtual Visits (Telemedicine).  Patients are able to view lab/test results, encounter notes, upcoming appointments, etc.  Non-urgent messages can be sent to your provider as well.   To learn more about what you can do with MyChart, go to [email or web address].    Your next appointment:   As needed  The format for your next appointment:   In Person  Provider:   Lennie OdorWesley O'Neal, MD   Other Instructions Drink plenty of water.       Signed, Lenna GilfordWesley T. Flora Lipps'Neal, MD [facility]  [facility]  [address] [place], [place] [id number] [phone]  11/18/2019 4:47 PM

## 2019-11-18 ENCOUNTER — Encounter: Payer: Self-pay | Admitting: Cardiovascular Disease

## 2019-11-18 ENCOUNTER — Other Ambulatory Visit: Payer: Self-pay

## 2019-11-18 ENCOUNTER — Ambulatory Visit: Payer: BC Managed Care – PPO | Admitting: Cardiovascular Disease

## 2019-11-18 VITALS — BP 124/78 | HR 100 | Temp 96.9°F | Ht 62.0 in | Wt 168.0 lb

## 2019-11-18 DIAGNOSIS — R011 Cardiac murmur, unspecified: Secondary | ICD-10-CM | POA: Diagnosis not present

## 2019-11-18 DIAGNOSIS — R55 Syncope and collapse: Secondary | ICD-10-CM | POA: Diagnosis not present

## 2019-11-18 NOTE — Patient Instructions (Signed)
Medication Instructions:  The current medical regimen is effective;  continue present plan and medications.  *If you need a refill on your cardiac medications before your next appointment, please call your pharmacy*   Testing/Procedures: Echocardiogram - Your physician has requested that you have an echocardiogram. Echocardiography is a painless test that uses sound waves to create images of your heart. It provides your doctor with information about the size and shape of your heart and how well your heart's chambers and valves are working. This procedure takes approximately one hour. There are no restrictions for this procedure. This will be performed at our [facility] location - [address].    Follow-Up: At [facility], you and your health needs are our priority.  As part of our continuing mission to provide you with exceptional heart care, we have created designated Provider Care Teams.  These Care Teams include your primary Cardiologist (physician) and Advanced Practice Providers (APPs -  Physician Assistants and Nurse Practitioners) who all work together to provide you with the care you need, when you need it.  We recommend signing up for the patient portal called "MyChart".  Sign up information is provided on this After Visit Summary.  MyChart is used to connect with patients for Virtual Visits (Telemedicine).  Patients are able to view lab/test results, encounter notes, upcoming appointments, etc.  Non-urgent messages can be sent to your provider as well.   To learn more about what you can do with MyChart, go to [email or web address].    Your next appointment:   As needed  The format for your next appointment:   In Person  Provider:   Lennie Odor, MD   Other Instructions Drink plenty of water.

## 2019-11-19 ENCOUNTER — Telehealth: Payer: Self-pay | Admitting: Critical Care Medicine

## 2019-11-19 ENCOUNTER — Telehealth: Payer: Self-pay

## 2019-11-19 NOTE — Telephone Encounter (Signed)
Advised pt of results. Pt understood and nothing further is needed.      Steffanie Dunn, DO  11/13/2019 2:00 PM EDT     Please let Jasmine [name] know that her PFTs show that her asthma is under good control. If she is having coughing still it may be due to allergies. Her allergist Dr. Delorse Lek has been copied to this test result.  LPC

## 2019-11-19 NOTE — Telephone Encounter (Signed)
Patient received her first Xolair injection on 11-05-2019. She reports that she had severe kidney pain a few days afterwards. She also had a seizure last weekend and she has never had that issue before. She also has low potassium. Patient does not wish to move forward with Xolair injections. Patient is feeling much better now and is stable.

## 2019-11-19 NOTE — Telephone Encounter (Signed)
Okay thanks for letting me know.  It is not likely that her kidney and the seizure issue is related to Xolair.  However I do understand her concern and we can discontinue per her request.

## 2019-11-19 NOTE — Telephone Encounter (Signed)
Will move patient to inactive

## 2019-12-03 ENCOUNTER — Ambulatory Visit: Payer: Self-pay

## 2019-12-09 ENCOUNTER — Encounter: Payer: Self-pay | Admitting: Allergy

## 2019-12-09 ENCOUNTER — Ambulatory Visit (HOSPITAL_COMMUNITY): Payer: BC Managed Care – PPO | Attending: Cardiovascular Disease

## 2019-12-09 ENCOUNTER — Ambulatory Visit: Payer: BC Managed Care – PPO | Admitting: Allergy

## 2019-12-09 ENCOUNTER — Other Ambulatory Visit: Payer: Self-pay

## 2019-12-09 VITALS — BP 130/88 | HR 102 | Resp 16

## 2019-12-09 DIAGNOSIS — J301 Allergic rhinitis due to pollen: Secondary | ICD-10-CM

## 2019-12-09 DIAGNOSIS — L508 Other urticaria: Secondary | ICD-10-CM

## 2019-12-09 DIAGNOSIS — R55 Syncope and collapse: Secondary | ICD-10-CM

## 2019-12-09 DIAGNOSIS — R011 Cardiac murmur, unspecified: Secondary | ICD-10-CM | POA: Diagnosis present

## 2019-12-09 DIAGNOSIS — J4540 Moderate persistent asthma, uncomplicated: Secondary | ICD-10-CM | POA: Diagnosis not present

## 2019-12-09 LAB — ECHOCARDIOGRAM COMPLETE
Area-P 1/2: 4.89 cm2
S' Lateral: 2.3 cm

## 2019-12-09 NOTE — Patient Instructions (Addendum)
Urticaria (hives)  - at this time etiology of hives is unknown however most common etiology is idiopathic or spontaneous.  Hives can be caused by a variety of different triggers including illness/infection, foods, medications, stings, exercise, pressure, vibrations, extremes of temperature to name a few however majority of the time there is no identifiable trigger.    - labwork was not remarkable other than positive IgE to hickory tree pollen and elevated inflammatory marker which is a marker for inflammation in the body   - environmental and food allergy skin testing from initial visit was all negative   - for management of hives at this time continue a high-dose antihistamine regimen as follows: Xyzal 5mg  twice a day, Pepcid 20mg  twice a day and Singulair 10mg  daily   - should symptoms recur, a journal is to be kept recording any foods eaten, beverages consumed, medications taken within a 6 hour period prior to the onset of symptoms, as well as record activities being performed, and environmental conditions.   Allergic rhinitis  - positive to hickory tree pollen  - can use Azelastine nasal spray, 1-2 sprays per nostril 2 times daily as needed for nasal drainage. Proper nasal spray technique has been discussed and demonstrated.  - nasal saline spray (i.e., Simply Saline) or nasal saline lavage (i.e., NeilMed) is recommended as needed and prior to medicated nasal sprays.  - use Xyzal and Singulair as above  Asthma  - history of childhood asthma.    - symptoms somewhat better with Trelegy.  Continue Trelegy 1 puff daily for now  - have access to albuterol inhaler 2 puffs every 4-6 hours as needed for cough/wheeze/shortness of breath/chest tightness.  May use 15-20 minutes prior to activity.   Monitor frequency of use.     - discussed today continued cough symptoms may be vocal cord dysfunction.  This is where your vocal cords can move when they are not supposed to this can cause a sensation of  difficulty breathing.  This can be exacerbated by prolonged talking.  We will refer you to ENT for an airway evaluation.  Follow-up 4-6 months or sooner if needed

## 2019-12-09 NOTE — Progress Notes (Signed)
Follow-up Note  RE: Jasmine [name] MRN: [id number] DOB: 1990/12/15 Date of Office Visit: 12/09/2019   History of present illness: Jasmine [name] is a 29 y.o. female presenting today for follow-up of hives and cough/wheeze.  History and physical obtained by Quincy Simmonds, MD, [facility] resident.   She was last seen on 10/22/2019. She states that she no longer has hives. Still occasionally notices pruritus after being outside for long periods. She received one dose of Xolair on 11/05/2019 and noticed back pain and right ear pain a couple days later, no resolved. She was evaluated for a syncopal episode on 11/15/2019 while at a wedding rehearsal, history was consistent with vasovagal syncope. She has had no other episodes since then.  However she is concerned these symptoms may be related Xolair given the timing. She has stopped the Xolair and no longer wishes to continue it. She is using Xyzal, Pepcid, and Singulair and feels the hives are adequately controlled on current regimen. She continues to have a cough/wheeze with associated feeling of shortness of breath that started after having Covid in November 2020. Notes improvement since switching from Advair to Trelegy. Now she mostly has symptoms with masking or if she talks for long periods. She also notes chest tightness with prolonged periods of cough/wehezing. Saw Pulmonologist for lung function tests that were normal. She has a history of tonsillectomy but has not followed with ENT recently.  Her rhinitis is well controlled on Xyzal and Singulair. No longer having watery eyes or runny nose. Has not needed to sue nose sprays.     Review of systems: Review of Systems  HENT: Negative.   Respiratory: Positive for cough, shortness of breath and wheezing.   Cardiovascular: Negative.   Gastrointestinal: Negative.   Musculoskeletal: Negative.   Skin: Negative.     All other systems negative unless noted above  in HPI  Past medical/social/surgical/family history have been reviewed and are unchanged unless specifically indicated below.  No changes  Medication List: Current Outpatient Medications  Medication Sig Dispense Refill  . ALPRAZolam (XANAX) 0.25 MG tablet Take by mouth.    . cetirizine (ZYRTEC) 10 MG tablet Take 10 mg by mouth daily.    . Fluticasone-Umeclidin-Vilant (TRELEGY ELLIPTA) 200-62.5-25 MCG/INH AEPB Inhale 1 puff into the lungs daily. 60 each 2  . montelukast (SINGULAIR) 10 MG tablet Take 10 mg by mouth at bedtime.     . sertraline (ZOLOFT) 100 MG tablet Take 100 mg by mouth daily.    Marland Kitchen VYVANSE 40 MG CHEW Chew 40 mg by mouth daily.     Marland Kitchen EPINEPHrine (AUVI-Q) 0.3 mg/0.3 mL IJ SOAJ injection Inject 0.3 mLs (0.3 mg total) into the muscle once as needed for anaphylaxis. (Patient not taking: Reported on 12/09/2019) 2 each 1   No current facility-administered medications for this visit.     Known medication allergies: Allergies  Allergen Reactions  . Pollen Extract-Tree Extract [Pollen Extract] Hives  . Shagbark Hickory Hives     Physical examination: Blood pressure 130/88, pulse (!) 102, resp. rate 16, SpO2 99 %.  General: Alert, interactive, in no acute distress. HEENT: TMs pearly gray, turbinates non-edematous without discharge, post-pharynx non erythematous. Neck: Supple without lymphadenopathy. Lungs: Clear to auscultation without wheezing, rhonchi or rales. {no increased work of breathing. CV: Normal S1, S2 without murmurs. Abdomen: Nondistended, nontender. Skin: Warm and dry, without lesions or rashes. Extremities:  No clubbing, cyanosis or edema. Neuro:   Grossly intact.  Diagnositics/Labs: Labs:  Component  Latest Ref Rng & Units 10/22/2019  D Pteronyssinus IgE     Class 0 kU/L <0.10  D Farinae IgE     Class 0 kU/L <0.10  Cat Dander IgE     Class 0 kU/L <0.10  Dog Dander IgE     Class 0 kU/L <0.10  [place] Grass IgE     Class 0 kU/L <0.10  Timothy  Grass IgE     Class 0 kU/L <0.10  Johnson Grass IgE     Class 0 kU/L <0.10  Bahia Grass IgE     Class 0 kU/L <0.10  Cockroach, American IgE     Class 0 kU/L <0.10  Penicillium Chrysogen IgE     Class 0 kU/L <0.10  Cladosporium Herbarum IgE     Class 0 kU/L <0.10  Aspergillus Fumigatus IgE     Class 0 kU/L <0.10  Mucor Racemosus IgE     Class 0 kU/L <0.10  Alternaria Alternata IgE     Class 0 kU/L <0.10  Stemphylium Herbarum IgE     Class 0 kU/L <0.10  Common Silver Charletta Cousin IgE     Class 0 kU/L <0.10  Oak, White IgE     Class 0 kU/L <0.10  Elm, American IgE     Class 0 kU/L <0.10  Maple/Box Elder IgE     Class 0 kU/L <0.10  Hickory, White IgE     Class 0/I kU/L 0.30 (A)  Amer Sycamore IgE Qn     Class 0 kU/L <0.10  White Mulberry IgE     Class 0 kU/L <0.10  Sweet gum IgE RAST Ql     Class 0 kU/L <0.10  Cedar, [place] IgE     Class 0 kU/L <0.10  Ragweed, Short IgE     Class 0 kU/L <0.10  Mugwort IgE Qn     Class 0 kU/L <0.10  Plantain, English IgE     Class 0 kU/L <0.10  Pigweed, Rough IgE     Class 0 kU/L <0.10  Sheep Sorrel IgE Qn     Class 0 kU/L <0.10  Nettle IgE     Class 0 kU/L <0.10  WBC     3.4 - 10.8 x10E3/uL 9.1  RBC     3.77 - 5.28 x10E6/uL 4.66  Hemoglobin     11.1 - 15.9 g/dL 70.2  HCT     63.7 - 85.8 % 42.7  MCV     79 - 97 fL 92  MCH     26.6 - 33.0 pg 31.3  MCHC     31 - 35 g/dL 85.0  RDW     27.7 - 41.2 % 13.3  Neutrophils     Not Estab. % 58  Lymphs     Not Estab. % 35  Monocytes     Not Estab. % 5  Eos     Not Estab. % 0  Basos     Not Estab. % 1  NEUT#     1 - 7 x10E3/uL 5.4  Lymphocyte #     0 - 3 x10E3/uL 3.2 (H)  Monocytes Absolute     0 - 0 x10E3/uL 0.5  EOS (ABSOLUTE)     0.0 - 0.4 x10E3/uL 0.0  Basophils Absolute     0 - 0 x10E3/uL 0.1  Immature Granulocytes     Not Estab. % 1  Immature Grans (Abs)     0.0 - 0.1 x10E3/uL 0.1  Glucose  65 - 99 mg/dL 098124 (H)  BUN     6 - 20 mg/dL 13  Creatinine      1.190.57 - 1.00 mg/dL 1.470.62  GFR, Est Non African American     >59 mL/min/1.73 123  GFR, Est African American     >59 mL/min/1.73 142  BUN/Creatinine Ratio     9 - 23 21  Sodium     134 - 144 mmol/L 137  Potassium     3.5 - 5.2 mmol/L 4.3  Chloride     96 - 106 mmol/L 93 (L)  CO2     20 - 29 mmol/L 22  Calcium     8.7 - 10.2 mg/dL 9.7  Total Protein     6.0 - 8.5 g/dL 6.9  Albumin     3.9 - 5.0 g/dL 4.5  Globulin, Total     1.5 - 4.5 g/dL 2.4  Albumin/Globulin Ratio     1.2 - 2.2 1.9  Total Bilirubin     0.0 - 1.2 mg/dL 0.3  Alkaline Phosphatase     48 - 121 IU/L 106  AST     0 - 40 IU/L 11  ALT     0 - 32 IU/L 17  Thyroperoxidase Ab SerPl-aCnc     0 - 34 IU/mL <9  Thyroglobulin Antibody     0.0 - 0.9 IU/mL <1.0  Anti Nuclear Antibody (ANA)     Negative Negative  cu index     <10 <1.1  Sed Rate     0 - 32 mm/hr 60 (H)  Tryptase     2.2 - 13.2 ug/L 3.8  TSH     0.450 - 4.500 uIU/mL 2.200     Assessment and plan:   Urticaria (hives)  - at this time etiology of hives is unknown however most common etiology is idiopathic or spontaneous.  Hives can be caused by a variety of different triggers including illness/infection, foods, medications, stings, exercise, pressure, vibrations, extremes of temperature to name a few however majority of the time there is no identifiable trigger.    - labwork was not remarkable other than positive IgE to hickory tree pollen and elevated inflammatory marker which is a marker for inflammation in the body   - environmental and food allergy skin testing from initial visit was all negative   - for management of hives at this time continue a high-dose antihistamine regimen as follows: Xyzal 5mg  twice a day, Pepcid 20mg  twice a day and Singulair 10mg  daily   - should symptoms recur, a journal is to be kept recording any foods eaten, beverages consumed, medications taken within a 6 hour period prior to the onset of symptoms, as well as record  activities being performed, and environmental conditions.   Allergic rhinitis  - positive to hickory tree pollen.   - can use Azelastine nasal spray, 1-2 sprays per nostril 2 times daily as needed for nasal drainage. Proper nasal spray technique has been discussed and demonstrated.  - nasal saline spray (i.e., Simply Saline) or nasal saline lavage (i.e., NeilMed) is recommended as needed and prior to medicated nasal sprays.  - use Xyzal and Singulair as above  Asthma  - history of childhood asthma.    - symptoms somewhat better with Trelegy.  Continue Trelegy 1 puff daily for now  - have access to albuterol inhaler 2 puffs every 4-6 hours as needed for cough/wheeze/shortness of breath/chest tightness.  May use 15-20 minutes prior to activity.  Monitor frequency of use.     - discussed today continued cough symptoms may be vocal cord dysfunction.  This is where your vocal cords can move when they are not supposed to this can cause a sensation of difficulty breathing.  This can be exacerbated by prolonged talking.  We will refer you to ENT for an airway evaluation.  Follow-up 4-6 months or sooner if needed  I appreciate the opportunity to take part in Yeraldi's care. Please do not hesitate to contact me with questions.  Sincerely,   Margo Aye, MD Allergy/Immunology [facility]

## 2019-12-10 ENCOUNTER — Telehealth: Payer: Self-pay

## 2019-12-10 NOTE — Telephone Encounter (Signed)
-----   Message from [facility] Larose Hires, MD sent at 12/09/2019  1:25 PM EDT ----- Please place ENT referral for airway evaluation re: possible VCD

## 2019-12-10 NOTE — Telephone Encounter (Signed)
Referral has been placed to [facility] ENT for review and scheduling. I left a detailed voicemail for the patient with this information.   Phone: [phone]  [address] [place], [place] [address]

## 2019-12-21 ENCOUNTER — Encounter: Payer: Self-pay | Admitting: *Deleted

## 2019-12-22 ENCOUNTER — Encounter: Payer: Self-pay | Admitting: Diagnostic Neuroimaging

## 2019-12-22 ENCOUNTER — Ambulatory Visit: Payer: BC Managed Care – PPO | Admitting: Diagnostic Neuroimaging

## 2019-12-22 ENCOUNTER — Telehealth: Payer: Self-pay | Admitting: *Deleted

## 2019-12-22 NOTE — Telephone Encounter (Signed)
Patient was no show for new patient appointment today. 

## 2020-01-19 ENCOUNTER — Ambulatory Visit: Payer: BC Managed Care – PPO | Admitting: Diagnostic Neuroimaging

## 2020-01-26 ENCOUNTER — Ambulatory Visit (INDEPENDENT_AMBULATORY_CARE_PROVIDER_SITE_OTHER): Payer: BC Managed Care – PPO | Admitting: Neurology

## 2020-01-26 ENCOUNTER — Encounter: Payer: Self-pay | Admitting: Neurology

## 2020-01-26 VITALS — BP 145/91 | HR 106 | Ht 62.5 in | Wt 185.0 lb

## 2020-01-26 DIAGNOSIS — R55 Syncope and collapse: Secondary | ICD-10-CM

## 2020-01-26 DIAGNOSIS — G40209 Localization-related (focal) (partial) symptomatic epilepsy and epileptic syndromes with complex partial seizures, not intractable, without status epilepticus: Secondary | ICD-10-CM

## 2020-01-26 NOTE — Progress Notes (Signed)
[facility] [address] [place]. [place] [address] [phone]       OFFICE CONSULT NOTE  Jasmine [name] Date of Birth:  [birth year] Medical Record Number:  [id number]   Referring MD:   Larose Hires  Reason for Referral: Seizure versus syncope  HPI: Jasmine [name] is a pleasant 29 year old [place] African lady from [place] who is seen for initial office consultation visit today.  History is obtained from her and telephonic discussion with her eyewitness friend Jasmine [name] as well as review of electronic medical records and available in imaging films in PACS.  Patient has no significant past medical history except allergies and anxiety.  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  Patient states that she had had a hectic weekend and had been walking a lot.  She was practicing with other bridesmaid the entry to the ceremony when she started not feeling well and apparently passed out.  I have weakness noticed that the patient speech became jumbled and nonsensical and right arm went up and then she fell down clenching her teeth and started jerking her extremities.  Her friend whom I spoke to today stated that she was in the other room and when she came she found her on the floor and shaking for about a minute.  She bit her tongue and clenched her teeth tightly and her routine nurse came out.  Patient remained unresponsive for short time and subsequently was found to be confused and disoriented and her eyes were out of focus.  She was also noted to be drooling but there was no incontinence.  She felt tired the rest of the day.  She did not participate much in activities after that.  Patient did admit that it was a hot day and she had not had much to eat or drink that day.  Patient denied any prior history of syncopal episodes of similar episodes.  There is no prior  history of febrile seizures or childhood epilepsy.  No history of significant head injury with loss of consciousness or any neurological problems.  She had lab work in the ER which showed low potassium 3.2 which was replaced with CBC and rest of labs unremarkable.  She was seen by cardiologist Dr. Ivar Drape who thought this was a classical vasovagal syncope.  Echocardiogram was done on 12/09/2019 which was normal.She apparently took Xolair injection for allergies a few days prior to the event.    Thyroid function tests were normal done by primary care physician.  EKG done in cardiology office showed normal sinus rhythm without abnormalities.  She was found to have soft ejection systolic murmur which cardiologist felt was benign.  She denies drinking alcohol or doing street drugs and irregular eating or sleeping habits.  ROS:   14 system review of systems is positive for loss of consciousness, confusion, disorientation, tongue bite, tiredness, memory loss, anxiety and all other systems negative PMH:  Past Medical History:  Diagnosis Date  . Allergic rhinitis   . Allergy    hickory tree; from Dr [facility] notes  . Anemia   . Asthma    from Dr Chauncy Passy notes  . COVID-19 03/2019  . Heart murmur   . History of positive PPD 2011  . Hyperlipidemia   . Hypokalemia    from Dr [facility] notes  . Migraines   . Seizures (HCC)   . Urticaria  Social History:  Social History   Socioeconomic History  . Marital status: Single    Spouse name: Not on file  . Number of children: Not on file  . Years of education: Not on file  . Highest education level: Not on file  Occupational History  . Occupation: Fish farm manager: [employer]  Tobacco Use  . Smoking status: Never Smoker  . Smokeless tobacco: Never Used  Vaping Use  . Vaping Use: Never used  Substance and Sexual Activity  . Alcohol use: No  . Drug use: No  . Sexual activity: Not on file  Other Topics Concern  . Not on file  Social  History Narrative   Lives with her family    Right handed   Caffeine: none currently   Social Determinants of Health   Financial Resource Strain:   . Difficulty of Paying Living Expenses: Not on file  Food Insecurity:   . Worried About Programme researcher, broadcasting/film/video in the Last Year: Not on file  . Ran Out of Food in the Last Year: Not on file  Transportation Needs:   . Lack of Transportation (Medical): Not on file  . Lack of Transportation (Non-Medical): Not on file  Physical Activity:   . Days of Exercise per Week: Not on file  . Minutes of Exercise per Session: Not on file  Stress:   . Feeling of Stress : Not on file  Social Connections:   . Frequency of Communication with Friends and Family: Not on file  . Frequency of Social Gatherings with Friends and Family: Not on file  . Attends Religious Services: Not on file  . Active Member of Clubs or Organizations: Not on file  . Attends Banker Meetings: Not on file  . Marital Status: Not on file  Intimate Partner Violence:   . Fear of Current or Ex-Partner: Not on file  . Emotionally Abused: Not on file  . Physically Abused: Not on file  . Sexually Abused: Not on file    Medications:   Current Outpatient Medications on File Prior to Visit  Medication Sig Dispense Refill  . ALBUTEROL IN Inhale into the lungs as needed.    . ALPRAZolam (XANAX) 0.25 MG tablet Take by mouth as needed.     Marland Kitchen azelastine (ASTELIN) 0.1 % nasal spray Place 1-2 sprays into both nostrils 2 (two) times daily. Use in each nostril as directed    . buPROPion (WELLBUTRIN XL) 150 MG 24 hr tablet Take 300 mg by mouth daily.    . cetirizine (ZYRTEC) 10 MG tablet Take 10 mg by mouth daily.    . Eflornithine HCl (VANIQA) 13.9 % cream Apply topically.    . famotidine (PEPCID) 20 MG tablet Take 20 mg by mouth 2 (two) times daily as needed.    . ferrous sulfate 325 (65 FE) MG tablet Take 325 mg by mouth daily with breakfast.    . Fluticasone-Umeclidin-Vilant  (TRELEGY ELLIPTA) 200-62.5-25 MCG/INH AEPB Inhale 1 puff into the lungs daily. 60 each 2  . montelukast (SINGULAIR) 10 MG tablet Take 10 mg by mouth at bedtime.     . sertraline (ZOLOFT) 100 MG tablet Take 100 mg by mouth daily.    Marland Kitchen VYVANSE 60 MG CHEW Chew 1 tablet by mouth daily.     No current facility-administered medications on file prior to visit.    Allergies:   Allergies  Allergen Reactions  . Pollen Extract-Tree Extract [Pollen Extract] Hives  .  Shagbark Hickory Hives    Physical Exam General: Mildly obese young African-American lady seated, in no evident distress Head: head normocephalic and atraumatic.   Neck: supple with no carotid or supraclavicular bruits Cardiovascular: regular rate and rhythm, no murmurs Musculoskeletal: no deformity Skin:  no rash/petichiae Vascular:  Normal pulses all extremities  Neurologic Exam Mental Status: Awake and fully alert. Oriented to place and time. Recent and remote memory intact. Attention span, concentration and fund of knowledge appropriate. Mood and affect appropriate.  Recall 3/3.  Able to name 13 animals which can walk on 4 legs. Cranial Nerves: Fundoscopic exam reveals sharp disc margins. Pupils equal, briskly reactive to light. Extraocular movements full without nystagmus. Visual fields full to confrontation. Hearing intact. Facial sensation intact. Face, tongue, palate moves normally and symmetrically.  Motor: Normal bulk and tone. Normal strength in all tested extremity muscles. Sensory.: intact to touch , pinprick , position and vibratory sensation.  Coordination: Rapid alternating movements normal in all extremities. Finger-to-nose and heel-to-shin performed accurately bilaterally. Gait and Station: Arises from chair without difficulty. Stance is normal. Gait demonstrates normal stride length and balance . Able to heel, toe and tandem walk without difficulty.  Reflexes: 1+ and symmetric. Toes downgoing.       ASSESSMENT:  29 year old [place] African lady with single episode of weakness loss of consciousness followed by confusion and disorientation likely simple partial seizure with secondary generalization.  Convulsive syncope is less likely based on description by eyewitnesses.  Normal neurological exam.  Unremarkable prior neurological history.  She has mild subjective memory difficulties likely related to underlying anxiety.    PLAN: I had a long discussion with the patient regarding her single episode of weakness brief loss of consciousness with jerking followed by confusion disorientation likely represents partial seizure with secondary generalization rather than convulsive syncope.  Recommend further evaluation by checking EEG as well as MRI scan of the brain with and without contrast.  Patient was advised not to drive for 6 months as per [facility] and avoid seizure provoking stimuli like sleep deprivation, irregular eating and sleeping habits and alcohol and stimulants.  We also discussed risk benefit of starting anticonvulsants and I recommend waiting till evaluation is completed on unless she has a second unprovoked seizure.  Greater than 50% time during this 45-minute consultation visit was spent on counseling and coordination of care about her episode of loss of consciousness and discussion about seizure versus convulsive syncope and answering questions.  She will return for follow-up in the future in 2 months to follow earlier if necessary. Jasmine Heady, MD Note: This document was prepared with digital dictation and possible smart phrase technology. Any transcriptional errors that result from this process are unintentional.

## 2020-01-26 NOTE — Patient Instructions (Signed)
I had a long discussion with the patient regarding her single episode of weakness brief loss of consciousness with jerking followed by confusion disorientation likely represents partial seizure with secondary generalization rather than convulsive syncope.  Recommend further evaluation by checking EEG as well as MRI scan of the brain with and without contrast.  Patient was advised not to drive for 6 months as per [facility] and avoid seizure provoking stimuli like sleep deprivation, irregular eating and sleeping habits and alcohol and stimulants.  We also discussed risk benefit of starting anticonvulsants and I recommend waiting till evaluation is completed on unless she has a second unprovoked seizure.  She will return for follow-up in the future in 2 months to follow earlier if necessary.  Seizure, Adult A seizure is a sudden burst of abnormal electrical activity in the brain. Seizures usually last from 30 seconds to 2 minutes. They can cause many different symptoms. Usually, seizures are not harmful unless they last a long time. What are the causes? Common causes of this condition include:  Fever or infection.  Conditions that affect the brain, such as: ? A brain abnormality that you were born with. ? A brain or head injury. ? Bleeding in the brain. ? A tumor. ? Stroke. ? Brain disorders such as autism or cerebral palsy.  Low blood sugar.  Conditions that are passed from parent to child (are inherited).  Problems with substances, such as: ? Having a reaction to a drug or a medicine. ? Suddenly stopping the use of a substance (withdrawal). In some cases, the cause may not be known. A person who has repeated seizures over time without a clear cause has a condition called epilepsy. What increases the risk? You are more likely to get this condition if you have:  A family history of epilepsy.  Had a seizure in the past.  A brain disorder.  A history of head injury, lack of  oxygen at birth, or strokes. What are the signs or symptoms? There are many types of seizures. The symptoms vary depending on the type of seizure you have. Examples of symptoms during a seizure include:  Shaking (convulsions).  Stiffness in the body.  Passing out (losing consciousness).  Head nodding.  Staring.  Not responding to sound or touch.  Loss of bladder control and bowel control. Some people have symptoms right before and right after a seizure happens. Symptoms before a seizure may include:  Fear.  Worry (anxiety).  Feeling like you may vomit (nauseous).  Feeling like the room is spinning (vertigo).  Feeling like you saw or heard something before (dj vu).  Odd tastes or smells.  Changes in how you see. You may see flashing lights or spots. Symptoms after a seizure happens can include:  Confusion.  Sleepiness.  Headache.  Weakness on one side of the body. How is this treated? Most seizures will stop on their own in under 5 minutes. In these cases, no treatment is needed. Seizures that last longer than 5 minutes will usually need treatment. Treatment can include:  Medicines given through an IV tube.  Avoiding things that are known to cause your seizures. These can include medicines that you take for another condition.  Medicines to treat epilepsy.  Surgery to stop the seizures. This may be needed if medicines do not help. Follow these instructions at home: Medicines  Take over-the-counter and prescription medicines only as told by your doctor.  Do not eat or drink anything that may keep  your medicine from working, such as alcohol. Activity  Do not do any activities that would be dangerous if you had another seizure, like driving or swimming. Wait until your doctor says it is safe for you to do them.  If you live in the [place] ask your local DMV (department of motor vehicles) when you can drive.  Get plenty of rest. Teaching others Teach  friends and family what to do when you have a seizure. They should:  Lay you on the ground.  Protect your head and body.  Loosen any tight clothing around your neck.  Turn you on your side.  Not hold you down.  Not put anything into your mouth.  Know whether or not you need emergency care.  Stay with you until you are better.  General instructions  Contact your doctor each time you have a seizure.  Avoid anything that gives you seizures.  Keep a seizure diary. Write down: ? What you think caused each seizure. ? What you remember about each seizure.  Keep all follow-up visits as told by your doctor. This is important. Contact a doctor if:  You have another seizure.  You have seizures more often.  There is any change in what happens during your seizures.  You keep having seizures with treatment.  You have symptoms of being sick or having an infection. Get help right away if:  You have a seizure that: ? Lasts longer than 5 minutes. ? Is different than seizures you had before. ? Makes it harder to breathe. ? Happens after you hurt your head.  You have any of these symptoms after a seizure: ? Not being able to speak. ? Not being able to use a part of your body. ? Confusion. ? A bad headache.  You have two or more seizures in a row.  You do not wake up right after a seizure.  You get hurt during a seizure. These symptoms may be an emergency. Do not wait to see if the symptoms will go away. Get medical help right away. Call your local emergency services (911 in the [place] Do not drive yourself to the hospital. Summary  Seizures usually last from 30 seconds to 2 minutes. Usually, they are not harmful unless they last a long time.  Do not eat or drink anything that may keep your medicine from working, such as alcohol.  Teach friends and family what to do when you have a seizure.  Contact your doctor each time you have a seizure. This information is not  intended to replace advice given to you by your health care provider. Make sure you discuss any questions you have with your health care provider. Document Revised: 07/25/2018 Document Reviewed: 07/25/2018 Elsevier Patient Education  2020 [employer].

## 2020-01-27 ENCOUNTER — Other Ambulatory Visit: Payer: Self-pay

## 2020-01-27 ENCOUNTER — Encounter: Payer: Self-pay | Admitting: Critical Care Medicine

## 2020-01-27 ENCOUNTER — Ambulatory Visit (INDEPENDENT_AMBULATORY_CARE_PROVIDER_SITE_OTHER): Payer: BC Managed Care – PPO | Admitting: Neurology

## 2020-01-27 ENCOUNTER — Ambulatory Visit (INDEPENDENT_AMBULATORY_CARE_PROVIDER_SITE_OTHER): Payer: BC Managed Care – PPO | Admitting: Critical Care Medicine

## 2020-01-27 ENCOUNTER — Telehealth: Payer: Self-pay | Admitting: Neurology

## 2020-01-27 VITALS — BP 136/86 | HR 93 | Temp 98.4°F | Ht 62.5 in | Wt 183.8 lb

## 2020-01-27 DIAGNOSIS — G40209 Localization-related (focal) (partial) symptomatic epilepsy and epileptic syndromes with complex partial seizures, not intractable, without status epilepticus: Secondary | ICD-10-CM | POA: Diagnosis not present

## 2020-01-27 DIAGNOSIS — J4540 Moderate persistent asthma, uncomplicated: Secondary | ICD-10-CM | POA: Diagnosis not present

## 2020-01-27 DIAGNOSIS — J309 Allergic rhinitis, unspecified: Secondary | ICD-10-CM

## 2020-01-27 DIAGNOSIS — Z8616 Personal history of COVID-19: Secondary | ICD-10-CM

## 2020-01-27 NOTE — Telephone Encounter (Signed)
LVM for pt to call back about scheduling mri  [employer] Auth: [id number] (exp. 01/27/20 to 07/24/20)

## 2020-01-27 NOTE — Patient Instructions (Addendum)
Thank you for visiting Dr. Chestine Spore at [facility]. We recommend the following: Orders Placed This Encounter  Procedures  . Ambulatory referral to ENT   Orders Placed This Encounter  Procedures  . Ambulatory referral to ENT    Referral Priority:   Routine    Referral Type:   Consultation    Referral Reason:   Specialty Services Required    Requested Specialty:   Otolaryngology    Number of Visits Requested:   1   Con't trelegy once daily.    Return in about 3 months (around 04/27/2020). with Dr. Celine Mans.    Please do your part to reduce the spread of COVID-19.

## 2020-01-27 NOTE — Progress Notes (Signed)
Synopsis: Referred in January 2021 for post-covid by Lewis Moccasin, MD.  Subjective:   PATIENT ID: Jasmine [name] GENDER: female DOB: 23-Apr-1991, MRN: [id number]  Chief Complaint  Patient presents with  . Follow-up    Wheezing. feels she just not breathing right since she contracted covid in November     Jasmine [name] is a 29 y/o woman who presents for follow-up.  She has had wheezing and shortness of breath since COVID in November 2020.  She continues to have "episodes" that are somewhere between the sensation of wheezing and shortness of breath that only occurs when talking, but she denies coughing.  She feels like she cannot breathe and has to actively calm herself down during these episodes.  She says that during these episodes people frequently think that she is sneezing.  Since her last visit she has had a tonic-clonic seizure, possibly due to Xolair.  She has discontinued Xolair, which her allergist had prescribed for hives.  She saw neurology yesterday and has an upcoming brain MRI scheduled.  Her allergist sent her to see Dr. Pollyann Kennedy from ENT, who did not feel that she had an indication for laryngoscopy.  She is requesting a repeat ENT referral.  She has seen Dr. Ezzard Standing in the past, who performed her tonsillectomy several years ago.  She continues to take Trelegy daily and is scared to stop it.    OV 09/02/19: [name] is a 29 y/o woman with a history of moderate persistent asthma who presents for follow up. Since her last visit she has been doing well.  She only has occasional shortness of breath, but she notices it the most when she wears a mask.  She had no trouble wearing a mask prior to catching Covid in the fall.  She continues on Advair, Singulair and all allergy medicines-cetirizine, hydroxyzine, famotidine.  She follows with an allergist.  No issues with heartburn.  She mostly uses her albuterol when she is more active, and there are some days she is  not needed at all.  She plans on starting to exercise soon.  She is scheduled for her first Covid vaccine this week.   OV 07/08/19: Jasmine [name] is following up for epistaxis that has been going on for 2 weeks. She is worried it is due to the meds she is on. Due to the colder temps, the heat has been running more, and she has stopped using her humidifier at work. Still on Advair, levocetirizine, montelukast, azelastine, flonase. Cough & SOB both much better. CP is also improved. She completed the prednisone but had increased appetite and did not like taking it.    06/03/19 OV: Jasmine [name] is a 29 y/o woman who presents for evaluation of dyspnea, wheezing, chest pain persistent after having Covid in mid November 2020.  She and her entire family had Covid at the same time, and everyone else's symptoms have fully resolved.  She previously had a severe cough, which has since improved.  She continues to have shortness of breath, which causes wheezing, which frequently causes her chest pain.  Her chest pain is on the right side along her sternum and she describes as aching.  It worsens when she carries heavy things and resolves with rest.  Overall it has been improving with coughing less.  Overall her symptoms have improved slowly, but have not fully resolved.  Talking exacerbates her symptoms.  He has had more headaches since having Covid.  Her sore throat has resolved.  She was started on Symbicort twice daily and albuterol as needed, which she has been taking every 4-5 hours due to wheezing.  She was given a course of azithromycin in late November, which did not improve her symptoms.  She was prescribed prednisone, but does not think that this was ever picked up from the pharmacy and taken.  She is also taken Tessalon, continued her chronic montelukast for allergies, and has been started on levocetirizine.  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  She has a history of childhood asthma, but has been fine during her teens and 29s.  She inhalers as a child, but is not sure which ones.  She was never hospitalized for asthma as a child. She has a family history of allergies and one brother.  There is no other family history of asthma or lung disease.  Has not yet had her flu vaccine, but would like to do that today.     Past Medical History:  Diagnosis Date  . Allergic rhinitis   . Allergy    hickory tree; from Dr [facility] notes  . Anemia   . Asthma    from Dr Chauncy Passy notes  . COVID-19 03/2019  . Heart murmur   . History of positive PPD 2011  . Hyperlipidemia   . Hypokalemia    from [facility] notes  . Migraines   . Seizures (HCC)   . Urticaria      Family History  Problem Relation Age of Onset  . Diabetes Mother   . Hypertension Mother   . Diabetes Father   . Hypertension Father   . Allergic rhinitis Brother   . Angioedema Neg Hx   . Asthma Neg Hx   . Atopy Neg Hx   . Eczema Neg Hx   . Immunodeficiency Neg Hx   . Urticaria Neg Hx   . Seizures Neg Hx      Past Surgical History:  Procedure Laterality Date  . TONSILLECTOMY      Social History   Socioeconomic History  . Marital status: Single    Spouse name: Not on file  . Number of children: Not on file  . Years of education: Not on file  . Highest education level: Not on file  Occupational History  . Occupation: Fish farm manager: [employer]  Tobacco Use  . Smoking status: Never Smoker  . Smokeless tobacco: Never Used  Vaping Use  . Vaping Use: Never used  Substance and Sexual Activity  . Alcohol use: No  . Drug use: No  . Sexual activity: Not on file  Other Topics Concern  . Not on file  Social History Narrative   Lives with her family    Right handed   Caffeine: none currently   Social Determinants of  Health   Financial Resource Strain:   . Difficulty of Paying Living Expenses: Not on file  Food Insecurity:   . Worried About Programme researcher, broadcasting/film/video in the Last Year: Not on file  . Ran Out of Food in the Last Year: Not on file  Transportation Needs:   . Lack of Transportation (Medical): Not on file  . Lack of Transportation (Non-Medical): Not on file  Physical Activity:   .  Days of Exercise per Week: Not on file  . Minutes of Exercise per Session: Not on file  Stress:   . Feeling of Stress : Not on file  Social Connections:   . Frequency of Communication with Friends and Family: Not on file  . Frequency of Social Gatherings with Friends and Family: Not on file  . Attends Religious Services: Not on file  . Active Member of Clubs or Organizations: Not on file  . Attends BankerClub or Organization Meetings: Not on file  . Marital Status: Not on file  Intimate Partner Violence:   . Fear of Current or Ex-Partner: Not on file  . Emotionally Abused: Not on file  . Physically Abused: Not on file  . Sexually Abused: Not on file     Allergies  Allergen Reactions  . Pollen Extract-Tree Extract [Pollen Extract] Hives  . [place] Hickory Hives     Immunization History  Administered Date(s) Administered  . Influenza,inj,Quad PF,6+ Mos 06/03/2019  . PFIZER SARS-COV-2 Vaccination 09/04/2019, 09/29/2019    Outpatient Medications Prior to Visit  Medication Sig Dispense Refill  . ALBUTEROL IN Inhale into the lungs as needed.    . ALPRAZolam (XANAX) 0.25 MG tablet Take by mouth as needed.     Marland Kitchen. azelastine (ASTELIN) 0.1 % nasal spray Place 1-2 sprays into both nostrils 2 (two) times daily. Use in each nostril as directed    . buPROPion (WELLBUTRIN XL) 150 MG 24 hr tablet Take 300 mg by mouth daily.    . cetirizine (ZYRTEC) 10 MG tablet Take 10 mg by mouth daily.    . Eflornithine HCl (VANIQA) 13.9 % cream Apply topically.    . famotidine (PEPCID) 20 MG tablet Take 20 mg by mouth 2 (two) times  daily as needed.    . ferrous sulfate 325 (65 FE) MG tablet Take 325 mg by mouth daily with breakfast.    . Fluticasone-Umeclidin-Vilant (TRELEGY ELLIPTA) 200-62.5-25 MCG/INH AEPB Inhale 1 puff into the lungs daily. 60 each 2  . montelukast (SINGULAIR) 10 MG tablet Take 10 mg by mouth at bedtime.     . sertraline (ZOLOFT) 100 MG tablet Take 100 mg by mouth daily.    Marland Kitchen. VYVANSE 60 MG CHEW Chew 1 tablet by mouth daily.     No facility-administered medications prior to visit.    Review of Systems  Constitutional: Negative for fever and weight loss.  HENT: Negative for congestion and sore throat.   Respiratory: Positive for shortness of breath and wheezing. Negative for cough.   Cardiovascular: Positive for chest pain.  Neurological: Positive for headaches.     Objective:   Vitals:   01/27/20 1601  BP: 136/86  Pulse: 93  Temp: 98.4 F (36.9 C)  TempSrc: Oral  SpO2: 100%  Weight: 183 lb 12.8 oz (83.4 kg)  Height: 5' 2.5" (1.588 m)   100% on   RA BMI Readings from Last 3 Encounters:  01/27/20 33.08 kg/m  01/26/20 33.30 kg/m  11/18/19 30.73 kg/m   Wt Readings from Last 3 Encounters:  01/27/20 183 lb 12.8 oz (83.4 kg)  01/26/20 185 lb (83.9 kg)  11/18/19 168 lb (76.2 kg)    Physical Exam Vitals reviewed.  Constitutional:      General: She is not in acute distress.    Appearance: She is not ill-appearing.  HENT:     Head: Normocephalic and atraumatic.  Eyes:     General: No scleral icterus. Cardiovascular:     Rate and Rhythm: Normal  rate and regular rhythm.     Heart sounds: No murmur heard.   Pulmonary:     Effort: No respiratory distress.     Comments: Frequent coughing tick-like like episodes during the encounter causing truncated speech.  No accessory muscle use. CTAB. Abdominal:     General: There is no distension.     Palpations: Abdomen is soft.     Tenderness: There is no abdominal tenderness.  Musculoskeletal:        General: No swelling or  deformity.     Cervical back: Neck supple.  Lymphadenopathy:     Cervical: No cervical adenopathy.  Skin:    General: Skin is warm and dry.     Findings: No rash.  Neurological:     General: No focal deficit present.     Mental Status: She is alert.     Coordination: Coordination normal.  Psychiatric:        Mood and Affect: Mood normal.        Behavior: Behavior normal.      CBC    Component Value Date/Time   WBC 9.8 11/15/2019 0120   RBC 4.84 11/15/2019 0120   HGB 15.0 11/15/2019 0120   HGB 14.6 10/22/2019 1647   HCT 46.7 (H) 11/15/2019 0120   HCT 42.7 10/22/2019 1647   PLT 316 11/15/2019 0120   MCV 96.5 11/15/2019 0120   MCV 92 10/22/2019 1647   MCH 31.0 11/15/2019 0120   MCHC 32.1 11/15/2019 0120   RDW 13.4 11/15/2019 0120   RDW 13.3 10/22/2019 1647   LYMPHSABS 1.7 11/15/2019 0120   LYMPHSABS 3.2 (H) 10/22/2019 1647   MONOABS 0.5 11/15/2019 0120   EOSABS 0.0 11/15/2019 0120   EOSABS 0.0 10/22/2019 1647   BASOSABS 0.1 11/15/2019 0120   BASOSABS 0.1 10/22/2019 1647    CHEMISTRY No results for input(s): NA, K, CL, CO2, GLUCOSE, BUN, CREATININE, CALCIUM, MG, PHOS in the last 168 hours. CrCl cannot be calculated (Patient's most recent lab result is older than the maximum 21 days allowed.).   Chest Imaging- films reviewed: CXR, 2 view 11/08/2009-normal  Pulmonary Functions Testing Results: PFT Results Latest Ref Rng & Units 11/13/2019  FVC-Pre L 3.10  FVC-Predicted Pre % 87  FVC-Post L 3.01  FVC-Predicted Post % 85  Pre FEV1/FVC % % 85  Post FEV1/FCV % % 87  FEV1-Pre L 2.65  FEV1-Predicted Pre % 87  FEV1-Post L 2.62  DLCO uncorrected ml/min/mmHg 22.18  DLCO UNC% % 105  DLCO corrected ml/min/mmHg 21.43  DLCO COR %Predicted % 102  DLVA Predicted % 122  TLC L 3.92  TLC % Predicted % 82  RV % Predicted % 16   2021- No significant obstruction or restriction.  No bronchodilator reversibility.  Normal diffusion.     Assessment & Plan:     ICD-10-CM    1. Allergic rhinitis, unspecified seasonality, unspecified trigger  J30.9 Ambulatory referral to ENT  2. History of COVID-19  Z86.16 Ambulatory referral to ENT  3. Moderate persistent asthma, unspecified whether complicated  J45.40 Ambulatory referral to ENT      Persistent respiratory symptoms after COVID-19 infection.  Previously has improved with asthma treatments, but has not returned to her baseline.  Suspect vocal cord dysfunction may contribute to ongoing symptoms. -Continue Trelegy daily for now.  Rinse her mouth after every use.  -Would like to de-escalate treatment after evaluation by ENT.  Referral to Dr. Benancio Deeds for evaluation of vocal cord dysfunction. -Continue albuterol every 4 hours as  needed -Continue allergy meds-cetirizine, Singulair, Pepcid, hydroxyzine per her allergist recommendations. No role for biologics. -Up-to-date on Covid vaccination.  Needs seasonal flu vaccination when available.  -Continue regular physical activity as much as able. RTC in 2 to 3 months after PFTs.  RTC in 3 months with Dr. Celine Mans.   Current Outpatient Medications:  .  ALBUTEROL IN, Inhale into the lungs as needed., Disp: , Rfl:  .  ALPRAZolam (XANAX) 0.25 MG tablet, Take by mouth as needed. , Disp: , Rfl:  .  azelastine (ASTELIN) 0.1 % nasal spray, Place 1-2 sprays into both nostrils 2 (two) times daily. Use in each nostril as directed, Disp: , Rfl:  .  buPROPion (WELLBUTRIN XL) 150 MG 24 hr tablet, Take 300 mg by mouth daily., Disp: , Rfl:  .  cetirizine (ZYRTEC) 10 MG tablet, Take 10 mg by mouth daily., Disp: , Rfl:  .  Eflornithine HCl (VANIQA) 13.9 % cream, Apply topically., Disp: , Rfl:  .  famotidine (PEPCID) 20 MG tablet, Take 20 mg by mouth 2 (two) times daily as needed., Disp: , Rfl:  .  ferrous sulfate 325 (65 FE) MG tablet, Take 325 mg by mouth daily with breakfast., Disp: , Rfl:  .  Fluticasone-Umeclidin-Vilant (TRELEGY ELLIPTA) 200-62.5-25 MCG/INH AEPB, Inhale 1 puff into the  lungs daily., Disp: 60 each, Rfl: 2 .  montelukast (SINGULAIR) 10 MG tablet, Take 10 mg by mouth at bedtime. , Disp: , Rfl:  .  sertraline (ZOLOFT) 100 MG tablet, Take 100 mg by mouth daily., Disp: , Rfl:  .  VYVANSE 60 MG CHEW, Chew 1 tablet by mouth daily., Disp: , Rfl:     Steffanie Dunn, DO [facility] 01/27/2020 4:26 PM

## 2020-01-27 NOTE — Telephone Encounter (Signed)
Patient returned my call she is scheduled at [facility] for 02/02/20.

## 2020-01-28 NOTE — Progress Notes (Signed)
Kindly inform patient that EEG study was normal

## 2020-02-02 ENCOUNTER — Other Ambulatory Visit: Payer: Self-pay

## 2020-02-02 ENCOUNTER — Other Ambulatory Visit: Payer: BC Managed Care – PPO

## 2020-02-02 ENCOUNTER — Ambulatory Visit (INDEPENDENT_AMBULATORY_CARE_PROVIDER_SITE_OTHER): Payer: BC Managed Care – PPO

## 2020-02-02 DIAGNOSIS — G40209 Localization-related (focal) (partial) symptomatic epilepsy and epileptic syndromes with complex partial seizures, not intractable, without status epilepticus: Secondary | ICD-10-CM

## 2020-02-02 MED ORDER — GADOBENATE DIMEGLUMINE 529 MG/ML IV SOLN
15.0000 mL | Freq: Once | INTRAVENOUS | Status: AC | PRN
Start: 2020-02-02 — End: 2020-02-02
  Administered 2020-02-02: 15 mL via INTRAVENOUS

## 2020-02-05 ENCOUNTER — Ambulatory Visit: Payer: BC Managed Care – PPO | Admitting: Critical Care Medicine

## 2020-02-09 NOTE — Progress Notes (Signed)
Kindly inform the patient her MRI scan of the brain was normal

## 2020-02-22 ENCOUNTER — Other Ambulatory Visit: Payer: Self-pay

## 2020-02-22 ENCOUNTER — Ambulatory Visit (INDEPENDENT_AMBULATORY_CARE_PROVIDER_SITE_OTHER): Payer: BC Managed Care – PPO | Admitting: Otolaryngology

## 2020-02-22 ENCOUNTER — Encounter (INDEPENDENT_AMBULATORY_CARE_PROVIDER_SITE_OTHER): Payer: Self-pay | Admitting: Otolaryngology

## 2020-02-22 VITALS — Temp 97.0°F

## 2020-02-22 DIAGNOSIS — J310 Chronic rhinitis: Secondary | ICD-10-CM

## 2020-02-22 NOTE — Progress Notes (Signed)
HPI: Jasmine [name] is a 29 y.o. female who presents is referred by Dr. Duanne Guess for evaluation of intermittent mitten episodes of SOB where she gasps for air.  This apparently started following a severe case of COVID last November.  She has seen pulmonary and has been placed on inhalers.  She has also seen allergist and has also seen Dr. Pollyann Kennedy who felt like this was secondary to reflux disease.  She has taken an acid medication that she takes in the morning as well as elevated coffee.  But she still has persistent symptoms.  She feels like they get worse when she does a lot of talking.  She does not have the gasping episodes at night when she sleeps or during the day when she is not talking and just relaxing. She had a previous tonsillectomy and turbinate reductions performed by myself in December 2018.  Past Medical History:  Diagnosis Date  . Allergic rhinitis   . Allergy    hickory tree; from Dr [facility] notes  . Anemia   . Asthma    from Dr Chauncy Passy notes  . COVID-19 03/2019  . Heart murmur   . History of positive PPD 2011  . Hyperlipidemia   . Hypokalemia    from Dr [facility] notes  . Migraines   . Seizures (HCC)   . Urticaria    Past Surgical History:  Procedure Laterality Date  . TONSILLECTOMY     Social History   Socioeconomic History  . Marital status: Single    Spouse name: Not on file  . Number of children: Not on file  . Years of education: Not on file  . Highest education level: Not on file  Occupational History  . Occupation: Fish farm manager: [employer]  Tobacco Use  . Smoking status: Never Smoker  . Smokeless tobacco: Never Used  Vaping Use  . Vaping Use: Never used  Substance and Sexual Activity  . Alcohol use: No  . Drug use: No  . Sexual activity: Not on file  Other Topics Concern  . Not on file  Social History Narrative   Lives with her family    Right handed   Caffeine: none currently   Social Determinants of Health   Financial  Resource Strain:   . Difficulty of Paying Living Expenses: Not on file  Food Insecurity:   . Worried About Programme researcher, broadcasting/film/video in the Last Year: Not on file  . Ran Out of Food in the Last Year: Not on file  Transportation Needs:   . Lack of Transportation (Medical): Not on file  . Lack of Transportation (Non-Medical): Not on file  Physical Activity:   . Days of Exercise per Week: Not on file  . Minutes of Exercise per Session: Not on file  Stress:   . Feeling of Stress : Not on file  Social Connections:   . Frequency of Communication with Friends and Family: Not on file  . Frequency of Social Gatherings with Friends and Family: Not on file  . Attends Religious Services: Not on file  . Active Member of Clubs or Organizations: Not on file  . Attends Banker Meetings: Not on file  . Marital Status: Not on file   Family History  Problem Relation Age of Onset  . Diabetes Mother   . Hypertension Mother   . Diabetes Father   . Hypertension Father   . Allergic rhinitis Brother   . Angioedema Neg Hx   .  Asthma Neg Hx   . Atopy Neg Hx   . Eczema Neg Hx   . Immunodeficiency Neg Hx   . Urticaria Neg Hx   . Seizures Neg Hx    Allergies  Allergen Reactions  . Pollen Extract-Tree Extract [Pollen Extract] Hives  . Shagbark Hickory Hives   Prior to Admission medications   Medication Sig Start Date End Date Taking? Authorizing Provider  ALBUTEROL IN Inhale into the lungs as needed.   Yes [provider]  ALPRAZolam Prudy Feeler) 0.25 MG tablet Take by mouth as needed.  11/15/19  Yes [provider]  azelastine (ASTELIN) 0.1 % nasal spray Place 1-2 sprays into both nostrils 2 (two) times daily. Use in each nostril as directed   Yes [provider]  buPROPion (WELLBUTRIN XL) 150 MG 24 hr tablet Take 300 mg by mouth daily. 01/15/20  Yes [provider]  cetirizine (ZYRTEC) 10 MG tablet Take 10 mg by mouth daily.   Yes [provider]   Eflornithine HCl (VANIQA) 13.9 % cream Apply topically.   Yes [provider]  famotidine (PEPCID) 20 MG tablet Take 20 mg by mouth 2 (two) times daily as needed. 12/15/19  Yes [provider]  ferrous sulfate 325 (65 FE) MG tablet Take 325 mg by mouth daily with breakfast.   Yes [provider]  Fluticasone-Umeclidin-Vilant (TRELEGY ELLIPTA) 200-62.5-25 MCG/INH AEPB Inhale 1 puff into the lungs daily. 10/29/19  Yes Padgett, Pilar Grammes, MD  montelukast (SINGULAIR) 10 MG tablet Take 10 mg by mouth at bedtime.  01/22/19  Yes [provider]  sertraline (ZOLOFT) 100 MG tablet Take 100 mg by mouth daily. 07/21/19  Yes [provider]  VYVANSE 60 MG CHEW Chew 1 tablet by mouth daily. 01/19/20  Yes [provider]     Positive ROS: Otherwise negative  All other systems have been reviewed and were otherwise negative with the exception of those mentioned in the HPI and as above.  Physical Exam: Constitutional: Alert, well-appearing, no acute distress.  She has no wheezing and her voice sounds normal although she has these episodes of "gasping for air" but between the episodes there is no wheezing or voice problems. Ears: External ears without lesions or tenderness. Ear canals are clear bilaterally with intact, clear TMs.  Nasal: External nose without lesions. Septum is midline with mild rhinitis.. Clear nasal passages otherwise with no signs of infection. Oral: Lips and gums without lesions. Tongue and palate mucosa without lesions. Posterior oropharynx clear.  She is status post tonsillectomy. Indirect laryngoscopy revealed a clear base of tongue vallecula and epiglottis.  Vocal cords were clear bilaterally with normal vocal mobility. Neck: No palpable adenopathy or masses Respiratory: Breathing comfortably  Skin: No facial/neck lesions or rash noted.  Procedures  Assessment: I suspect these episodes are secondary to Covid and I discussed  this with her.  I would continue with the laryngeal pharyngeal reflux medication and switch her to omeprazole 40 mg daily before dinner.  She does not notice any reflux symptoms during the daytime but could be having reflux at night.  Although I am not sure she is having significant reflux causing the symptoms.  Plan: I discussed with the patient that I am not sure what is causing the episodes of "gasping for air" but I suspect is related to the Covid and hopefully this will gradually improve. Prescribed omeprazole to take 40 mg daily before dinner for the next 4 weeks.  I feel it would be  okay to go ahead and drink coffee in the mornings. Presently she does not feel like she can consciously stop the episodes that she is having but she is having no wheezing and no trouble breathing with apparent normal O2 was registered by her pulmonary doctor. Recommended use of nasal steroid spray at night if she has much congestion nasal passages are otherwise clear.   Narda Bonds, MD   CC:

## 2020-02-24 ENCOUNTER — Ambulatory Visit: Payer: BC Managed Care – PPO | Admitting: Diagnostic Neuroimaging

## 2020-03-07 ENCOUNTER — Other Ambulatory Visit: Payer: Self-pay

## 2020-03-07 ENCOUNTER — Emergency Department (HOSPITAL_COMMUNITY)
Admission: EM | Admit: 2020-03-07 | Discharge: 2020-03-07 | Disposition: A | Discharge status: Home / Self Care | Payer: BC Managed Care – PPO | Attending: Emergency Medicine | Admitting: Emergency Medicine

## 2020-03-07 DIAGNOSIS — R410 Disorientation, unspecified: Secondary | ICD-10-CM | POA: Insufficient documentation

## 2020-03-07 DIAGNOSIS — J45909 Unspecified asthma, uncomplicated: Secondary | ICD-10-CM | POA: Diagnosis not present

## 2020-03-07 DIAGNOSIS — R110 Nausea: Secondary | ICD-10-CM | POA: Insufficient documentation

## 2020-03-07 DIAGNOSIS — R42 Dizziness and giddiness: Secondary | ICD-10-CM | POA: Insufficient documentation

## 2020-03-07 DIAGNOSIS — Z8616 Personal history of COVID-19: Secondary | ICD-10-CM | POA: Diagnosis not present

## 2020-03-07 DIAGNOSIS — R55 Syncope and collapse: Secondary | ICD-10-CM | POA: Diagnosis not present

## 2020-03-07 DIAGNOSIS — I10 Essential (primary) hypertension: Secondary | ICD-10-CM | POA: Diagnosis not present

## 2020-03-07 DIAGNOSIS — Z79899 Other long term (current) drug therapy: Secondary | ICD-10-CM | POA: Diagnosis not present

## 2020-03-07 LAB — URINALYSIS, ROUTINE W REFLEX MICROSCOPIC
Bilirubin Urine: NEGATIVE
Glucose, UA: NEGATIVE mg/dL
Hgb urine dipstick: NEGATIVE
Ketones, ur: NEGATIVE mg/dL
Leukocytes,Ua: NEGATIVE
Nitrite: NEGATIVE
Protein, ur: NEGATIVE mg/dL
Specific Gravity, Urine: 1.003 — ABNORMAL LOW (ref 1.005–1.030)
pH: 7 (ref 5.0–8.0)

## 2020-03-07 LAB — I-STAT CHEM 8, ED
BUN: 16 mg/dL (ref 6–20)
Calcium, Ion: 0.74 mmol/L — CL (ref 1.15–1.40)
Chloride: 100 mmol/L (ref 98–111)
Creatinine, Ser: 0.6 mg/dL (ref 0.44–1.00)
Glucose, Bld: 125 mg/dL — ABNORMAL HIGH (ref 70–99)
HCT: 42 % (ref 36.0–46.0)
Hemoglobin: 14.3 g/dL (ref 12.0–15.0)
Potassium: 5.7 mmol/L — ABNORMAL HIGH (ref 3.5–5.1)
Sodium: 137 mmol/L (ref 135–145)
TCO2: 28 mmol/L (ref 22–32)

## 2020-03-07 LAB — BASIC METABOLIC PANEL
Anion gap: 14 (ref 5–15)
BUN: 15 mg/dL (ref 6–20)
CO2: 24 mmol/L (ref 22–32)
Calcium: 7.3 mg/dL — ABNORMAL LOW (ref 8.9–10.3)
Chloride: 99 mmol/L (ref 98–111)
Creatinine, Ser: 0.87 mg/dL (ref 0.44–1.00)
GFR, Estimated: >60 mL/min (ref >60)
Glucose, Bld: 122 mg/dL — ABNORMAL HIGH (ref 70–99)
Potassium: 5.5 mmol/L — ABNORMAL HIGH (ref 3.5–5.1)
Sodium: 137 mmol/L (ref 135–145)

## 2020-03-07 LAB — CBC
HCT: 43.3 % (ref 36.0–46.0)
Hemoglobin: 14.7 g/dL (ref 12.0–15.0)
MCH: 32 pg (ref 26.0–34.0)
MCHC: 33.9 g/dL (ref 30.0–36.0)
MCV: 94.3 fL (ref 80.0–100.0)
Platelets: 325 10*3/uL (ref 150–400)
RBC: 4.59 MIL/uL (ref 3.87–5.11)
RDW: 12.9 % (ref 11.5–15.5)
WBC: 7.7 10*3/uL (ref 4.0–10.5)
nRBC: 0 % (ref 0.0–0.2)

## 2020-03-07 LAB — TROPONIN I (HIGH SENSITIVITY)
Troponin I (High Sensitivity): <2 ng/L (ref <18)
Troponin I (High Sensitivity): 2 ng/L (ref <18)

## 2020-03-07 LAB — I-STAT BETA HCG BLOOD, ED (MC, WL, AP ONLY): I-stat hCG, quantitative: <5 m[IU]/mL (ref <5)

## 2020-03-07 LAB — CBG MONITORING, ED: Glucose-Capillary: 103 mg/dL — ABNORMAL HIGH (ref 70–99)

## 2020-03-07 LAB — POTASSIUM: Potassium: 4.7 mmol/L (ref 3.5–5.1)

## 2020-03-07 MED ORDER — SODIUM CHLORIDE 0.9 % IV BOLUS
1000.0000 mL | Freq: Once | INTRAVENOUS | Status: AC
  Administered 2020-03-07: 1000 mL via INTRAVENOUS

## 2020-03-07 NOTE — ED Notes (Signed)
Pt stated she missed work last week and experienced hive on back along with nausea. Her nausea came back today after she regained consciousness. Pt documented the hives (picture) and stated they were severely itchy. Patient states she still feels fairly light headed and nauseous.

## 2020-03-07 NOTE — ED Notes (Signed)
Patient had a critical value for calcium of 0.43mmol/L, doctor was notified.

## 2020-03-07 NOTE — ED Provider Notes (Signed)
Patient seen after prior ED provider.  Patient is asymptomatic.  Patient reports that she feels significantly better now.  Work-up did not demonstrate significant acute pathology.  Patient does appear to be appropriate for discharge.  Patient does understand need for close follow-up.  Strict return precautions given and understood.   Wynetta Fines, MD 03/07/20 1816
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
## 2020-03-07 NOTE — ED Notes (Signed)
Pt given water with instructions to drink

## 2020-03-07 NOTE — ED Provider Notes (Signed)
Jasmine [name]   CSN: [id number] Arrival date & time: 03/07/20  1322     History Chief Complaint  Patient presents with  . Dizziness  . Nausea    Jasmine [name] is a 29 y.o. female history of Covid infection in November, hypertension, hyperlipidemia here presenting with syncope.  Patient states that she was hungry and went to [employer].  She states that she ordered her food and then passed out.  She did not get a chance to eat her food.  She states that EMS was called and she woke up in the ambulance.  Patient denies any head injury.  She states that a similar episode happened back in June. She states that she saw a neurologist and cardiologist.  She was thought to have vasovagal syncope after extensive work-up.  Patient states that she Covid infection back in November last year and everything felt different since then. She denies any fevers or chills or cough.  Denies any trauma or injury while she passed out.  The history is provided by the patient.       Past Medical History:  Diagnosis Date  . Allergic rhinitis   . Allergy    hickory tree; from Dr [facility] notes  . Anemia   . Asthma    from Dr Chauncy Passy notes  . COVID-19 03/2019  . Heart murmur   . History of positive PPD 2011  . Hyperlipidemia   . Hypokalemia    from Dr [facility] notes  . Migraines   . Seizures (HCC)   . Urticaria     Patient Active Problem List   Diagnosis Date Noted  . Urticaria 03/09/2019  . Nonallergic rhinitis 03/09/2019  . Allergic reaction 03/09/2019    Past Surgical History:  Procedure Laterality Date  . TONSILLECTOMY       OB History   No obstetric history on file.     Family History  Problem Relation Age of Onset  . Diabetes Mother   . Hypertension Mother   . Diabetes Father   . Hypertension Father   . Allergic rhinitis Brother   . Angioedema Neg Hx   . Asthma Neg Hx   . Atopy Neg Hx   . Eczema  Neg Hx   . Immunodeficiency Neg Hx   . Urticaria Neg Hx   . Seizures Neg Hx     Social History   Tobacco Use  . Smoking status: Never Smoker  . Smokeless tobacco: Never Used  Vaping Use  . Vaping Use: Never used  Substance Use Topics  . Alcohol use: No  . Drug use: No    Home Medications Prior to Admission medications   Medication Sig Start Date End Date Taking? Authorizing Provider  ALBUTEROL IN Inhale into the lungs as needed.    [provider]  ALPRAZolam Prudy Feeler) 0.25 MG tablet Take by mouth as needed.  11/15/19   [provider]  azelastine (ASTELIN) 0.1 % nasal spray Place 1-2 sprays into both nostrils 2 (two) times daily. Use in each nostril as directed    [provider]  buPROPion (WELLBUTRIN XL) 150 MG 24 hr tablet Take 300 mg by mouth daily. 01/15/20   [provider]  cetirizine (ZYRTEC) 10 MG tablet Take 10 mg by mouth daily.    [provider]  Eflornithine HCl (VANIQA) 13.9 % cream Apply topically.    [provider]  famotidine (PEPCID) 20 MG tablet Take 20 mg by mouth  2 (two) times daily as needed. 12/15/19   [provider]  ferrous sulfate 325 (65 FE) MG tablet Take 325 mg by mouth daily with breakfast.    [provider]  Fluticasone-Umeclidin-Vilant (TRELEGY ELLIPTA) 200-62.5-25 MCG/INH AEPB Inhale 1 puff into the lungs daily. 10/29/19   Marcelyn Bruins, MD  montelukast (SINGULAIR) 10 MG tablet Take 10 mg by mouth at bedtime.  01/22/19   [provider]  sertraline (ZOLOFT) 100 MG tablet Take 100 mg by mouth daily. 07/21/19   [provider]  VYVANSE 60 MG CHEW Chew 1 tablet by mouth daily. 01/19/20   [provider]    Allergies    Pollen extract-tree extract [pollen extract] and Shagbark hickory  Review of Systems   Review of Systems  Neurological: Positive for dizziness.  All other systems reviewed and are negative.   Physical Exam Updated Vital  Signs BP 126/84 (BP Location: Left Arm)   Pulse 95   Temp 98.5 F (36.9 C) (Oral)   Resp 17   SpO2 100%   Physical Exam Vitals and nursing [name] reviewed.  Constitutional:      Comments: Slightly dehydrated  HENT:     Head: Normocephalic.     Comments: No signs of head injury    Nose: Nose normal.     Mouth/Throat:     Mouth: Mucous membranes are dry.  Eyes:     Extraocular Movements: Extraocular movements intact.     Pupils: Pupils are equal, round, and reactive to light.  Cardiovascular:     Rate and Rhythm: Normal rate and regular rhythm.     Pulses: Normal pulses.     Heart sounds: Normal heart sounds.  Pulmonary:     Effort: Pulmonary effort is normal.     Breath sounds: Normal breath sounds.  Abdominal:     General: Abdomen is flat.     Palpations: Abdomen is soft.  Musculoskeletal:        General: Normal range of motion.     Cervical back: Normal range of motion.     Comments: No spinal tenderness or extremity trauma  Skin:    General: Skin is warm.     Capillary Refill: Capillary refill takes less than 2 seconds.  Neurological:     General: No focal deficit present.     Mental Status: She is alert and oriented to person, place, and time.  Psychiatric:        Mood and Affect: Mood normal.        Behavior: Behavior normal.     ED Results / Procedures / Treatments   Labs (all labs ordered are listed, but only abnormal results are displayed) Labs Reviewed  CBG MONITORING, ED - Abnormal; Notable for the following components:      Result Value   Glucose-Capillary 103 (*)    All other components within normal limits  I-STAT CHEM 8, ED - Abnormal; Notable for the following components:   Potassium 5.7 (*)    Glucose, Bld 125 (*)    Calcium, Ion 0.74 (*)    All other components within normal limits  CBC  BASIC METABOLIC PANEL  URINALYSIS, ROUTINE W REFLEX MICROSCOPIC  I-STAT BETA HCG BLOOD, ED ([facility], [facility], [facility] ONLY)  TROPONIN I (HIGH SENSITIVITY)    EKG EKG  Interpretation  Date/Time:  Monday March 07 2020 14:44:14 EDT Ventricular Rate:  87 PR Interval:    QRS Duration: 79 QT Interval:  361 QTC Calculation: 435 R Axis:  64 Text Interpretation: Sinus rhythm No significant change since last tracing Confirmed by Richardean Canal [phone]) on 03/07/2020 2:48:29 PM   Radiology No results found.  Procedures Procedures (including critical care time)  Medications Ordered in ED Medications  sodium chloride 0.9 % bolus 1,000 mL (1,000 mLs Intravenous New Bag/Given 03/07/20 1438)    ED Course  I have reviewed the triage vital signs and the nursing notes.  Pertinent labs & imaging results that were available during my care of the patient were reviewed by me and considered in my medical decision making (see chart for details).    MDM Rules/Calculators/A&P                         Jasmine [name] is a 29 y.o. female here with syncope.  Likely vasovagal syncope versus dehydration.  Will check electrolytes and get orthostatics and will hydrate patient.  3:02 PM I-STAT Chem-8 showed low calcium.  I wonder if it is a lab error and BMP still pending.  If it is unremarkable, anticipate discharge.  Patient had extensive work-up for syncope several months ago. Signed out to Dr. Rodena Medin.    Final Clinical Impression(s) / ED Diagnoses Final diagnoses:  None    Rx / DC Orders ED Discharge Orders    None       Charlynne Pander, MD 03/07/20 1502

## 2020-03-07 NOTE — Discharge Instructions (Signed)
Stay hydrated   See your heart doctor and neurologist for follow up   Return to ER if you have another episode of passing out, seizures

## 2020-03-14 ENCOUNTER — Encounter: Payer: Self-pay | Admitting: Internal Medicine

## 2020-03-24 ENCOUNTER — Encounter: Payer: Self-pay | Admitting: Neurology

## 2020-03-24 ENCOUNTER — Ambulatory Visit (INDEPENDENT_AMBULATORY_CARE_PROVIDER_SITE_OTHER): Payer: BC Managed Care – PPO | Admitting: Neurology

## 2020-03-24 VITALS — BP 137/90 | HR 92 | Ht 62.0 in | Wt 189.0 lb

## 2020-03-24 DIAGNOSIS — R55 Syncope and collapse: Secondary | ICD-10-CM | POA: Diagnosis not present

## 2020-03-24 DIAGNOSIS — R569 Unspecified convulsions: Secondary | ICD-10-CM

## 2020-03-24 NOTE — Patient Instructions (Signed)
I had a long discussion with the patient and her mother regarding now her second episode of brief loss of consciousness followed by confusion and disorientation likely representing a complex partial seizure. I have advised him not to drive for the next 6 months as per [facility]. We went over results of her recent brain MRI scan and EEG both of which were unremarkable. I discussed the risk benefit of starting anticonvulsants but she is concerned about taking it long-term and would like time to think about it. I advised her to avoid seizure provoking stimuli like sleep deprivation, irregular eating and sleeping habits and extremes of exertion. If she is willing would recommend trying either lamotrigine or Keppra. She will return for follow-up in 3 months with my nurse practitioner call earlier if necessary.
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
## 2020-04-04 ENCOUNTER — Encounter: Payer: Self-pay | Admitting: *Deleted

## 2020-04-07 ENCOUNTER — Ambulatory Visit (INDEPENDENT_AMBULATORY_CARE_PROVIDER_SITE_OTHER): Payer: BC Managed Care – PPO | Admitting: Internal Medicine

## 2020-04-07 ENCOUNTER — Encounter: Payer: Self-pay | Admitting: Internal Medicine

## 2020-04-07 ENCOUNTER — Other Ambulatory Visit (INDEPENDENT_AMBULATORY_CARE_PROVIDER_SITE_OTHER): Payer: BC Managed Care – PPO

## 2020-04-07 ENCOUNTER — Encounter: Payer: Self-pay | Admitting: *Deleted

## 2020-04-07 VITALS — BP 124/60 | HR 104 | Ht 62.0 in | Wt 190.0 lb

## 2020-04-07 DIAGNOSIS — R110 Nausea: Secondary | ICD-10-CM | POA: Diagnosis not present

## 2020-04-07 DIAGNOSIS — R635 Abnormal weight gain: Secondary | ICD-10-CM | POA: Diagnosis not present

## 2020-04-07 DIAGNOSIS — R569 Unspecified convulsions: Secondary | ICD-10-CM | POA: Diagnosis not present

## 2020-04-07 LAB — CBC WITH DIFFERENTIAL/PLATELET
Basophils Absolute: 0.1 10*3/uL (ref 0.0–0.1)
Basophils Relative: 0.9 % (ref 0.0–3.0)
Eosinophils Absolute: 0 10*3/uL (ref 0.0–0.7)
Eosinophils Relative: 0.6 % (ref 0.0–5.0)
HCT: 40.4 % (ref 36.0–46.0)
Hemoglobin: 14.2 g/dL (ref 12.0–15.0)
Lymphocytes Relative: 44.2 % (ref 12.0–46.0)
Lymphs Abs: 3.3 10*3/uL (ref 0.7–4.0)
MCHC: 35.2 g/dL (ref 30.0–36.0)
MCV: 89.5 fl (ref 78.0–100.0)
Monocytes Absolute: 0.4 10*3/uL (ref 0.1–1.0)
Monocytes Relative: 4.8 % (ref 3.0–12.0)
Neutro Abs: 3.6 10*3/uL (ref 1.4–7.7)
Neutrophils Relative %: 49.5 % (ref 43.0–77.0)
Platelets: 370 10*3/uL (ref 150.0–400.0)
RBC: 4.52 Mil/uL (ref 3.87–5.11)
RDW: 13.9 % (ref 11.5–15.5)
WBC: 7.3 10*3/uL (ref 4.0–10.5)

## 2020-04-07 LAB — COMPREHENSIVE METABOLIC PANEL
ALT: 10 U/L (ref 0–35)
AST: 11 U/L (ref 0–37)
Albumin: 4.3 g/dL (ref 3.5–5.2)
Alkaline Phosphatase: 101 U/L (ref 39–117)
BUN: 12 mg/dL (ref 6–23)
CO2: 27 mEq/L (ref 19–32)
Calcium: 9.6 mg/dL (ref 8.4–10.5)
Chloride: 100 mEq/L (ref 96–112)
Creatinine, Ser: 0.54 mg/dL (ref 0.40–1.20)
GFR: 125.06 mL/min (ref >60.00)
Glucose, Bld: 93 mg/dL (ref 70–99)
Potassium: 3.7 mEq/L (ref 3.5–5.1)
Sodium: 137 mEq/L (ref 135–145)
Total Bilirubin: 0.3 mg/dL (ref 0.2–1.2)
Total Protein: 7.5 g/dL (ref 6.0–8.3)

## 2020-04-07 LAB — TSH: TSH: 2.52 u[IU]/mL (ref 0.35–4.50)

## 2020-04-07 LAB — LIPASE: Lipase: 12 U/L (ref 11.0–59.0)

## 2020-04-07 LAB — FERRITIN: Ferritin: 171.2 ng/mL (ref 10.0–291.0)

## 2020-04-07 LAB — CORTISOL: Cortisol, Plasma: 7.5 ug/dL

## 2020-04-07 MED ORDER — ONDANSETRON 4 MG PO TBDP: 4.0000 mg | ORAL_TABLET | Freq: Three times a day (TID) | ORAL | 0 refills | Status: DC | PRN

## 2020-04-07 NOTE — H&P (View-Only) (Signed)
Patient ID: Jasmine [name], female   DOB: Feb 18, 1991, 29 y.o.   MRN: [id number] HPI: Jasmine [name] is a 29 year old female with a past medical history of seizure disorder, anemia, COVID-19 in November 2020 requiring 3 month absence from work who is seen in consult at the request of Dr. Duanne Guess to evaluate chronic nausea.  She is here alone today.  She reports that she has had daily chronic and severe debilitating nausea for 10 years.  This dates back to prior to college at about age 62.  This seemed to worsen after her COVID-19 infection in November 2020.  She has a very uncomfortable nausea in her epigastrium and at times she can feel this, up into her chest.  She does vomit but rarely.  Nausea is more intense preceding and following seizure activity.  She has been seeing Dr. Pearlean Brownie for seizure disorder, last seizure 3 weeks ago, but has been reluctant to start antiepileptic medication due to possible side effect.  She denies feeling frequent heartburn though she was told by Dr. Pollyann Kennedy of ENT in August 2021 that she had reflux disease.  Diet modification was recommended.  She then saw another ENT which did not feel she had GERD.  She is not having any specific abdominal pain.  Bowel movements are regular and she eats [place] yogurt at night to help keep bowels normal.  She does feel she may be lactose intolerant.  She has gained 20 pounds since June of this year which is concerning to her and stressful to her.  No prior endoscopic evaluation  She was prescribed famotidine 20 mg twice daily for allergy but is no longer taking this medication.  She is single without children.  Works for [employer].  Never a tobacco user.  No alcohol use. No significant GI family history  MRI brain imaging was normal in September 2021  Past Medical History:  Diagnosis Date  . Allergic rhinitis   . Allergy    hickory tree; [facility]  . Amygdalolith   . Anemia   . Asthma     from Dr Chauncy Passy notes  . COVID-19 03/2019  . Generalized anxiety disorder   . Heart murmur   . History of positive PPD 2011  . Hyperlipidemia   . Hypokalemia    from Dr [facility]  . Irregular periods   . Migraines   . Seizures (HCC)   . Urticaria     Past Surgical History:  Procedure Laterality Date  . TONSILLECTOMY    . WISDOM TOOTH EXTRACTION      Outpatient Medications Prior to Visit  Medication Sig Dispense Refill  . ALBUTEROL IN Inhale into the lungs as needed.    Marland Kitchen azelastine (ASTELIN) 0.1 % nasal spray Place 1-2 sprays into both nostrils 2 (two) times daily. Use in each nostril as directed    . cetirizine (ZYRTEC) 10 MG tablet Take 10 mg by mouth daily.    . Eflornithine HCl (VANIQA) 13.9 % cream Apply topically.    . famotidine (PEPCID) 20 MG tablet Take 20 mg by mouth 2 (two) times daily as needed.    . ferrous sulfate 325 (65 FE) MG tablet Take 325 mg by mouth daily with breakfast.    . Fluticasone-Umeclidin-Vilant (TRELEGY ELLIPTA) 200-62.5-25 MCG/INH AEPB Inhale 1 puff into the lungs daily. 60 each 2  . montelukast (SINGULAIR) 10 MG tablet Take 10 mg by mouth at bedtime.     Marland Kitchen VYVANSE 60 MG CHEW  Chew 1 tablet by mouth daily.    Marland Kitchen ALPRAZolam (XANAX) 0.25 MG tablet Take by mouth as needed.     Marland Kitchen buPROPion (WELLBUTRIN XL) 150 MG 24 hr tablet Take 300 mg by mouth daily.    . sertraline (ZOLOFT) 100 MG tablet Take 100 mg by mouth daily.     No facility-administered medications prior to visit.    Allergies  Allergen Reactions  . Pollen Extract-Tree Extract [Pollen Extract] Hives  . Shagbark Hickory Hives    Family History  Problem Relation Age of Onset  . Diabetes Mother   . Hypertension Mother   . Diabetes Father   . Hypertension Father   . Allergic rhinitis Brother   . Angioedema Neg Hx   . Asthma Neg Hx   . Atopy Neg Hx   . Eczema Neg Hx   . Immunodeficiency Neg Hx   . Urticaria Neg Hx   . Seizures Neg Hx     Social History   Tobacco Use   . Smoking status: Never Smoker  . Smokeless tobacco: Never Used  Vaping Use  . Vaping Use: Never used  Substance Use Topics  . Alcohol use: No  . Drug use: No    ROS: As per history of present illness, otherwise negative  BP 124/60 (BP Location: Left Arm, Patient Position: Sitting, Cuff Size: Normal)   Pulse (!) 104   Ht 5\' 2"  (1.575 m) Comment: height measured without shoes  Wt 190 lb (86.2 kg)   BMI 34.75 kg/m  Gen: awake, alert, NAD HEENT: anicteric CV: RRR, no mrg Pulm: CTA b/l Abd: soft, NT/ND, +BS throughout Ext: no c/c/e Neuro: nonfocal   RELEVANT LABS AND IMAGING: CBC    Component Value Date/Time   WBC 7.7 03/07/2020 1418   RBC 4.59 03/07/2020 1418   HGB 14.3 03/07/2020 1427   HGB 14.6 10/22/2019 1647   HCT 42.0 03/07/2020 1427   HCT 42.7 10/22/2019 1647   PLT 325 03/07/2020 1418   MCV 94.3 03/07/2020 1418   MCV 92 10/22/2019 1647   MCH 32.0 03/07/2020 1418   MCHC 33.9 03/07/2020 1418   RDW 12.9 03/07/2020 1418   RDW 13.3 10/22/2019 1647   LYMPHSABS 1.7 11/15/2019 0120   LYMPHSABS 3.2 (H) 10/22/2019 1647   MONOABS 0.5 11/15/2019 0120   EOSABS 0.0 11/15/2019 0120   EOSABS 0.0 10/22/2019 1647   BASOSABS 0.1 11/15/2019 0120   BASOSABS 0.1 10/22/2019 1647    CMP     Component Value Date/Time   NA 137 03/07/2020 1427   NA 137 10/22/2019 1647   K 4.7 03/07/2020 1612   CL 100 03/07/2020 1427   CO2 24 03/07/2020 1418   GLUCOSE 125 (H) 03/07/2020 1427   BUN 16 03/07/2020 1427   BUN 13 10/22/2019 1647   CREATININE 0.60 03/07/2020 1427   CALCIUM 7.3 (L) 03/07/2020 1418   PROT 7.1 11/15/2019 0120   PROT 6.9 10/22/2019 1647   ALBUMIN 4.2 11/15/2019 0120   ALBUMIN 4.5 10/22/2019 1647   AST 23 11/15/2019 0120   ALT 14 11/15/2019 0120   ALKPHOS 82 11/15/2019 0120   BILITOT 0.6 11/15/2019 0120   BILITOT 0.3 10/22/2019 1647   GFRNONAA >60 03/07/2020 1418   GFRAA >60 11/15/2019 0120    ASSESSMENT/PLAN: 29 year old female with a past medical  history of seizure disorder, anemia, COVID-19 in November 2020 requiring 3 month absence from work who is seen in consult at the request of Dr. December 2020 to evaluate chronic nausea.  1.  Chronic, severe, nausea --we discussed that nausea can be a difficult symptom to determine etiology and that the differential diagnosis is broad.  MRI brain imaging normal.  Of note she does have a seizure disorder which is currently untreated.  However given the severity of her symptoms we will rule out the most common GI causes I recommend the following: --H. pylori stool antigen --CBC, CMP, celiac disease panel, lipase, TSH, cortisol --Stop oral iron as this medication can cause GI upset and nausea; check ferritin to ensure not low.  If low consider IV iron --Abdominal ultrasound, rule out gallbladder disease --Lactose breath testing to determine lactose intolerance --EGD; outpatient hospital setting given history of seizure disorder --Ondansetron 4 mg every 8 hours as needed severe nausea --If all of the above unrevealing consider PPI trial and gastric emptying scan  2.  Weight gain --check TSH, cortisol  3.  Seizure disorder --following with neurology but not yet on antiepileptic therapy.   RS:WNIOE, Christell Constant, Md [address] [place],  [place] [address]

## 2020-04-07 NOTE — Progress Notes (Signed)
Patient ID: Margaret Pyle, female   DOB: Feb 18, 1991, 29 y.o.   MRN: [id number] HPI: Savannha Welle is a 29 year old female with a past medical history of seizure disorder, anemia, COVID-19 in November 2020 requiring 3 month absence from work who is seen in consult at the request of Dr. Duanne Guess to evaluate chronic nausea.  She is here alone today.  She reports that she has had daily chronic and severe debilitating nausea for 10 years.  This dates back to prior to college at about age 62.  This seemed to worsen after her COVID-19 infection in November 2020.  She has a very uncomfortable nausea in her epigastrium and at times she can feel this, up into her chest.  She does vomit but rarely.  Nausea is more intense preceding and following seizure activity.  She has been seeing Dr. Pearlean Brownie for seizure disorder, last seizure 3 weeks ago, but has been reluctant to start antiepileptic medication due to possible side effect.  She denies feeling frequent heartburn though she was told by Dr. Pollyann Kennedy of ENT in August 2021 that she had reflux disease.  Diet modification was recommended.  She then saw another ENT which did not feel she had GERD.  She is not having any specific abdominal pain.  Bowel movements are regular and she eats [place] yogurt at night to help keep bowels normal.  She does feel she may be lactose intolerant.  She has gained 20 pounds since June of this year which is concerning to her and stressful to her.  No prior endoscopic evaluation  She was prescribed famotidine 20 mg twice daily for allergy but is no longer taking this medication.  She is single without children.  Works for [employer].  Never a tobacco user.  No alcohol use. No significant GI family history  MRI brain imaging was normal in September 2021  Past Medical History:  Diagnosis Date  . Allergic rhinitis   . Allergy    hickory tree; from Dr [facility] notes  . Amygdalolith   . Anemia   . Asthma     from Dr Chauncy Passy notes  . COVID-19 03/2019  . Generalized anxiety disorder   . Heart murmur   . History of positive PPD 2011  . Hyperlipidemia   . Hypokalemia    from Dr [facility] notes  . Irregular periods   . Migraines   . Seizures (HCC)   . Urticaria     Past Surgical History:  Procedure Laterality Date  . TONSILLECTOMY    . WISDOM TOOTH EXTRACTION      Outpatient Medications Prior to Visit  Medication Sig Dispense Refill  . ALBUTEROL IN Inhale into the lungs as needed.    Marland Kitchen azelastine (ASTELIN) 0.1 % nasal spray Place 1-2 sprays into both nostrils 2 (two) times daily. Use in each nostril as directed    . cetirizine (ZYRTEC) 10 MG tablet Take 10 mg by mouth daily.    . Eflornithine HCl (VANIQA) 13.9 % cream Apply topically.    . famotidine (PEPCID) 20 MG tablet Take 20 mg by mouth 2 (two) times daily as needed.    . ferrous sulfate 325 (65 FE) MG tablet Take 325 mg by mouth daily with breakfast.    . Fluticasone-Umeclidin-Vilant (TRELEGY ELLIPTA) 200-62.5-25 MCG/INH AEPB Inhale 1 puff into the lungs daily. 60 each 2  . montelukast (SINGULAIR) 10 MG tablet Take 10 mg by mouth at bedtime.     Marland Kitchen VYVANSE 60 MG CHEW  Chew 1 tablet by mouth daily.    Marland Kitchen ALPRAZolam (XANAX) 0.25 MG tablet Take by mouth as needed.     Marland Kitchen buPROPion (WELLBUTRIN XL) 150 MG 24 hr tablet Take 300 mg by mouth daily.    . sertraline (ZOLOFT) 100 MG tablet Take 100 mg by mouth daily.     No facility-administered medications prior to visit.    Allergies  Allergen Reactions  . Pollen Extract-Tree Extract [Pollen Extract] Hives  . [place] Hickory Hives    Family History  Problem Relation Age of Onset  . Diabetes Mother   . Hypertension Mother   . Diabetes Father   . Hypertension Father   . Allergic rhinitis Brother   . Angioedema Neg Hx   . Asthma Neg Hx   . Atopy Neg Hx   . Eczema Neg Hx   . Immunodeficiency Neg Hx   . Urticaria Neg Hx   . Seizures Neg Hx     Social History   Tobacco Use   . Smoking status: Never Smoker  . Smokeless tobacco: Never Used  Vaping Use  . Vaping Use: Never used  Substance Use Topics  . Alcohol use: No  . Drug use: No    ROS: As per history of present illness, otherwise negative  BP 124/60 (BP Location: Left Arm, Patient Position: Sitting, Cuff Size: Normal)   Pulse (!) 104   Ht 5\' 2"  (1.575 m) Comment: height measured without shoes  Wt 190 lb (86.2 kg)   BMI 34.75 kg/m  Gen: awake, alert, NAD HEENT: anicteric CV: RRR, no mrg Pulm: CTA b/l Abd: soft, NT/ND, +BS throughout Ext: no c/c/e Neuro: nonfocal   RELEVANT LABS AND IMAGING: CBC    Component Value Date/Time   WBC 7.7 03/07/2020 1418   RBC 4.59 03/07/2020 1418   HGB 14.3 03/07/2020 1427   HGB 14.6 10/22/2019 1647   HCT 42.0 03/07/2020 1427   HCT 42.7 10/22/2019 1647   PLT 325 03/07/2020 1418   MCV 94.3 03/07/2020 1418   MCV 92 10/22/2019 1647   MCH 32.0 03/07/2020 1418   MCHC 33.9 03/07/2020 1418   RDW 12.9 03/07/2020 1418   RDW 13.3 10/22/2019 1647   LYMPHSABS 1.7 11/15/2019 0120   LYMPHSABS 3.2 (H) 10/22/2019 1647   MONOABS 0.5 11/15/2019 0120   EOSABS 0.0 11/15/2019 0120   EOSABS 0.0 10/22/2019 1647   BASOSABS 0.1 11/15/2019 0120   BASOSABS 0.1 10/22/2019 1647    CMP     Component Value Date/Time   NA 137 03/07/2020 1427   NA 137 10/22/2019 1647   K 4.7 03/07/2020 1612   CL 100 03/07/2020 1427   CO2 24 03/07/2020 1418   GLUCOSE 125 (H) 03/07/2020 1427   BUN 16 03/07/2020 1427   BUN 13 10/22/2019 1647   CREATININE 0.60 03/07/2020 1427   CALCIUM 7.3 (L) 03/07/2020 1418   PROT 7.1 11/15/2019 0120   PROT 6.9 10/22/2019 1647   ALBUMIN 4.2 11/15/2019 0120   ALBUMIN 4.5 10/22/2019 1647   AST 23 11/15/2019 0120   ALT 14 11/15/2019 0120   ALKPHOS 82 11/15/2019 0120   BILITOT 0.6 11/15/2019 0120   BILITOT 0.3 10/22/2019 1647   GFRNONAA >60 03/07/2020 1418   GFRAA >60 11/15/2019 0120    ASSESSMENT/PLAN: 29 year old female with a past medical  history of seizure disorder, anemia, COVID-19 in November 2020 requiring 3 month absence from work who is seen in consult at the request of Dr. December 2020 to evaluate chronic nausea.  1.  Chronic, severe, nausea --we discussed that nausea can be a difficult symptom to determine etiology and that the differential diagnosis is broad.  MRI brain imaging normal.  Of note she does have a seizure disorder which is currently untreated.  However given the severity of her symptoms we will rule out the most common GI causes I recommend the following: --H. pylori stool antigen --CBC, CMP, celiac disease panel, lipase, TSH, cortisol --Stop oral iron as this medication can cause GI upset and nausea; check ferritin to ensure not low.  If low consider IV iron --Abdominal ultrasound, rule out gallbladder disease --Lactose breath testing to determine lactose intolerance --EGD; outpatient hospital setting given history of seizure disorder --Ondansetron 4 mg every 8 hours as needed severe nausea --If all of the above unrevealing consider PPI trial and gastric emptying scan  2.  Weight gain --check TSH, cortisol  3.  Seizure disorder --following with neurology but not yet on antiepileptic therapy.   RS:WNIOE, Christell Constant, Md [address] [place],  [place] [address]

## 2020-04-07 NOTE — Patient Instructions (Addendum)
Your provider has requested that you go to the basement level for lab work before leaving today. Press "B" on the elevator. The lab is located at the first door on the left as you exit the elevator.   We have sent the following medications to your pharmacy for you to pick up at your convenience: Zofran ODT 4 mg-- Take every 8 hours as needed for nausea/vomiting  You have been scheduled for an abdominal ultrasound at [facility] (1st floor of hospital) on Wednesday 04/13/20 at 10:00 am. Please arrive 30 minutes prior to your appointment for registration. Make certain not to have anything to eat or drink 6 hours prior to your appointment. Should you need to reschedule your appointment, please contact radiology at [phone]. This test typically takes about 30 minutes to perform.  You have been scheduled for an endoscopy. Please follow written instructions given to you at your visit today. If you use inhalers (even only as needed), please bring them with you on the day of your procedure.  Discontinue oral iron.  You have been given a kit to perform a lactose breath test which is completed by a company named [facility].  Please return this using the return mailing label given to you with the kit. Your demographics and insurance information have already been sent to the company and they should be in contact with you over the next week regarding this test. Please keep in mind that you will be getting this call from phone number [phone] or a similar number. If you do not hear from them within this time frame, please call our office at [phone].   If you are age 37 or younger, your body mass index should be between 19-25. Your Body mass index is 34.75 kg/m. If this is out of the aformentioned range listed, please consider follow up with your Primary Care Provider.   Due to recent changes in healthcare laws, you may see the results of your imaging and laboratory studies on MyChart  before your provider has had a chance to review them.  We understand that in some cases there may be results that are confusing or concerning to you. Not all laboratory results come back in the same time frame and the provider may be waiting for multiple results in order to interpret others.  Please give [place] 48 hours in order for your provider to thoroughly review all the results before contacting the office for clarification of your results.

## 2020-04-08 LAB — HELICOBACTER PYLORI  SPECIAL ANTIGEN
MICRO NUMBER:: 11221119
RESULT:: DETECTED — AB
SPECIMEN QUALITY: ADEQUATE

## 2020-04-08 LAB — TISSUE TRANSGLUTAMINASE, IGA: (tTG) Ab, IgA: <1 U/mL

## 2020-04-08 LAB — IGA: Immunoglobulin A: 138 mg/dL (ref 47–310)

## 2020-04-08 NOTE — Telephone Encounter (Signed)
I have spoken to patient and have given her detailed instructions regarding preparation for her endoscopy as well as time/date/location and her need to a care partner 24 or older to bring her, stay for procedure, and take her home the day of her test due to sedation. I have also advised her of COVID testing appointment time/date/location. I explained that this information is also available to her via mychart and she is free to call back with any questions. She verbalizes understanding of this information.

## 2020-04-08 NOTE — Addendum Note (Signed)
Addended by: Richardson Chiquito on: 04/08/2020 04:31 PM   Modules accepted: Orders

## 2020-04-13 ENCOUNTER — Ambulatory Visit (HOSPITAL_COMMUNITY): Admission: RE | Admit: 2020-04-13 | Payer: BC Managed Care – PPO | Source: Ambulatory Visit

## 2020-04-16 ENCOUNTER — Other Ambulatory Visit (HOSPITAL_COMMUNITY): Payer: BC Managed Care – PPO

## 2020-04-18 ENCOUNTER — Telehealth: Payer: Self-pay | Admitting: Internal Medicine

## 2020-04-19 ENCOUNTER — Ambulatory Visit (INDEPENDENT_AMBULATORY_CARE_PROVIDER_SITE_OTHER): Payer: BC Managed Care – PPO | Admitting: Otolaryngology

## 2020-04-20 ENCOUNTER — Other Ambulatory Visit: Payer: Self-pay

## 2020-04-20 ENCOUNTER — Ambulatory Visit (HOSPITAL_COMMUNITY)
Admission: RE | Admit: 2020-04-20 | Discharge: 2020-04-20 | Disposition: A | Discharge status: Home / Self Care | Payer: BC Managed Care – PPO | Source: Ambulatory Visit | Attending: Internal Medicine | Admitting: Internal Medicine

## 2020-04-20 DIAGNOSIS — R110 Nausea: Secondary | ICD-10-CM

## 2020-04-20 NOTE — Progress Notes (Signed)
Attempted to obtain medical history via telephone, unable to reach at this time. I left a voicemail to return pre surgical testing department's phone call.  

## 2020-04-23 ENCOUNTER — Other Ambulatory Visit (HOSPITAL_COMMUNITY)
Admission: RE | Admit: 2020-04-23 | Discharge: 2020-04-23 | Disposition: A | Discharge status: Home / Self Care | Payer: BC Managed Care – PPO | Source: Ambulatory Visit | Attending: Internal Medicine | Admitting: Internal Medicine

## 2020-04-23 DIAGNOSIS — K2980 Duodenitis without bleeding: Secondary | ICD-10-CM | POA: Diagnosis not present

## 2020-04-23 DIAGNOSIS — Z20822 Contact with and (suspected) exposure to covid-19: Secondary | ICD-10-CM | POA: Insufficient documentation

## 2020-04-23 DIAGNOSIS — Z8616 Personal history of COVID-19: Secondary | ICD-10-CM | POA: Diagnosis not present

## 2020-04-23 DIAGNOSIS — Z01812 Encounter for preprocedural laboratory examination: Secondary | ICD-10-CM | POA: Insufficient documentation

## 2020-04-23 DIAGNOSIS — R112 Nausea with vomiting, unspecified: Secondary | ICD-10-CM | POA: Diagnosis not present

## 2020-04-23 DIAGNOSIS — G40909 Epilepsy, unspecified, not intractable, without status epilepticus: Secondary | ICD-10-CM | POA: Diagnosis not present

## 2020-04-23 DIAGNOSIS — Z79899 Other long term (current) drug therapy: Secondary | ICD-10-CM | POA: Diagnosis not present

## 2020-04-23 LAB — SARS CORONAVIRUS 2 (TAT 6-24 HRS): SARS Coronavirus 2: NEGATIVE

## 2020-04-26 ENCOUNTER — Ambulatory Visit (HOSPITAL_COMMUNITY): Payer: BC Managed Care – PPO | Admitting: Registered Nurse

## 2020-04-26 ENCOUNTER — Ambulatory Visit (HOSPITAL_COMMUNITY)
Admission: RE | Admit: 2020-04-26 | Discharge: 2020-04-26 | Disposition: A | Discharge status: Home / Self Care | Payer: BC Managed Care – PPO | Attending: Internal Medicine | Admitting: Internal Medicine

## 2020-04-26 ENCOUNTER — Other Ambulatory Visit: Payer: Self-pay

## 2020-04-26 ENCOUNTER — Encounter (HOSPITAL_COMMUNITY)
Admission: RE | Disposition: A | Discharge status: Home / Self Care | Payer: Self-pay | Source: Home / Self Care | Attending: Internal Medicine

## 2020-04-26 ENCOUNTER — Encounter (HOSPITAL_COMMUNITY): Payer: Self-pay | Admitting: Internal Medicine

## 2020-04-26 DIAGNOSIS — Z8616 Personal history of COVID-19: Secondary | ICD-10-CM | POA: Insufficient documentation

## 2020-04-26 DIAGNOSIS — R112 Nausea with vomiting, unspecified: Secondary | ICD-10-CM | POA: Diagnosis not present

## 2020-04-26 DIAGNOSIS — K2980 Duodenitis without bleeding: Secondary | ICD-10-CM

## 2020-04-26 DIAGNOSIS — Z79899 Other long term (current) drug therapy: Secondary | ICD-10-CM | POA: Insufficient documentation

## 2020-04-26 DIAGNOSIS — R110 Nausea: Secondary | ICD-10-CM

## 2020-04-26 DIAGNOSIS — G40909 Epilepsy, unspecified, not intractable, without status epilepticus: Secondary | ICD-10-CM | POA: Insufficient documentation

## 2020-04-26 HISTORY — PX: ESOPHAGOGASTRODUODENOSCOPY (EGD) WITH PROPOFOL: SHX5813

## 2020-04-26 HISTORY — PX: BIOPSY: SHX5522

## 2020-04-26 SURGERY — ESOPHAGOGASTRODUODENOSCOPY (EGD) WITH PROPOFOL
Anesthesia: Monitor Anesthesia Care

## 2020-04-26 MED ORDER — SODIUM CHLORIDE 0.9 % IV SOLN: INTRAVENOUS | Status: DC

## 2020-04-26 MED ORDER — LACTATED RINGERS IV SOLN
INTRAVENOUS | Status: DC
  Administered 2020-04-26: 1000 mL via INTRAVENOUS

## 2020-04-26 MED ORDER — PROPOFOL 10 MG/ML IV BOLUS
INTRAVENOUS | Status: DC | PRN
  Administered 2020-04-26: 30 mg via INTRAVENOUS
  Administered 2020-04-26: 20 mg via INTRAVENOUS
  Administered 2020-04-26: 40 mg via INTRAVENOUS
  Administered 2020-04-26: 30 mg via INTRAVENOUS

## 2020-04-26 MED ORDER — LIDOCAINE 2% (20 MG/ML) 5 ML SYRINGE
INTRAMUSCULAR | Status: DC | PRN
  Administered 2020-04-26: 60 mg via INTRAVENOUS

## 2020-04-26 MED ORDER — PROPOFOL 1000 MG/100ML IV EMUL
INTRAVENOUS | Status: AC
  Filled 2020-04-26: qty 100

## 2020-04-26 SURGICAL SUPPLY — 14 items

## 2020-04-26 NOTE — Interval H&P Note (Signed)
History and Physical Interval Note: Patient presents for outpatient upper endoscopy to evaluate nausea. She stopped oral iron and her nausea is 70% better, she reports she feels very well She was diagnosed with H. pylori by stool antigen and Talicia was prescribed through specialty pharmacy, [facility].  She has not yet received this medication in the mail and thus has not started it. For upper endoscopy today to rule out structural causes for nausea though iron was definitive contributor and H. pylori may explain the remaining nausea.  Would normally plan gastric biopsies however given that she has not yet been treated unless there is abnormality seen gastric biopsies will likely not change management. The nature of the procedure, as well as the risks, benefits, and alternatives were carefully and thoroughly reviewed with the patient. Ample time for discussion and questions allowed. The patient understood, was satisfied, and agreed to proceed.    04/26/2020 11:33 AM  Fotini Melvern Banker  has presented today for surgery, with the diagnosis of chronic nausea.  The various methods of treatment have been discussed with the patient and family. After consideration of risks, benefits and other options for treatment, the patient has consented to  Procedure(s): ESOPHAGOGASTRODUODENOSCOPY (EGD) WITH PROPOFOL (N/A) as a surgical intervention.  The patient's history has been reviewed, patient examined, no change in status, stable for surgery.  I have reviewed the patient's chart and labs.  Questions were answered to the patient's satisfaction.     Carie Caddy Derreck Wiltsey

## 2020-04-26 NOTE — Op Note (Signed)
[facility] Patient Name: Jasmine BeringShazhalyassmeen Ahmad Procedure Date: 04/26/2020 MRN: [id number] Attending MD: Beverley FiedlerJay M Mylo Driskill , MD Date of Birth: October 24, 1990 CSN: [id number] Age: 29 Admit Type: Outpatient Procedure:                Upper GI endoscopy Indications:              Nausea Providers:                Carie CaddyJay M. Rhea BeltonPyrtle, MD, Clearnce Sorrelarrie Baker, RN, Leanne LovelyAllison                            Townsend, Technician, Pauline GoodAron Vaughn, Doreene BurkeBeth Marshall,                            CRNA Referring MD:             Lewis MoccasinElizabeth R. Dewey, MD Medicines:                Monitored Anesthesia Care Complications:            No immediate complications. Estimated Blood Loss:     Estimated blood loss was minimal. Procedure:                Pre-Anesthesia Assessment:                           - Prior to the procedure, a History and Physical                            was performed, and patient medications and                            allergies were reviewed. The patient's tolerance of                            previous anesthesia was also reviewed. The risks                            and benefits of the procedure and the sedation                            options and risks were discussed with the patient.                            All questions were answered, and informed consent                            was obtained. Prior Anticoagulants: The patient has                            taken no previous anticoagulant or antiplatelet                            agents. ASA Grade Assessment: II - A patient with  mild systemic disease. After reviewing the risks                            and benefits, the patient was deemed in                            satisfactory condition to undergo the procedure.                           After obtaining informed consent, the endoscope was                            passed under direct vision. Throughout the                            procedure, the  patient's blood pressure, pulse, and                            oxygen saturations were monitored continuously. The                            GIF-H190 ([id number] Olympus gastroscope was                            introduced through the mouth, and advanced to the                            second part of duodenum. The upper GI endoscopy was                            accomplished without difficulty. The patient                            tolerated the procedure well. Scope In: Scope Out: Findings:      The examined esophagus was normal. Z-line regular at 35 cm.      The entire examined stomach was normal. Biopsies not obtained as H.       Pylori recently diagnosed by stool antigen and antibiotic therapy not       yet started.      Localized mild inflammation characterized by erythema was found in the       duodenal bulb.      The second portion of the duodenum was normal. Biopsies were taken with       a cold forceps for histology. Impression:               - Normal esophagus.                           - Normal stomach.                           - Mild bulbar duodenitis (likely secondary to known                            H. Pylori pending treatment).                           -  Normal second portion of the duodenum. Biopsied. Moderate Sedation:      N/A Recommendation:           - Patient has a contact number available for                            emergencies. The signs and symptoms of potential                            delayed complications were discussed with the                            patient. Return to normal activities tomorrow.                            Written discharge instructions were provided to the                            patient.                           - Resume previous diet.                           - Continue present medications. Begin Talicia and                            complete as directed. H. Pylori stool antigen in 8                             weeks after treatment to confirm eradication.                           - Await pathology results.                           - Office follow-up if nausea persists. Nausea 70%                            better without oral iron and would expect further                            improvement after H. Pylori therapy. Procedure Code(s):        --- Professional ---                           [phone], Esophagogastroduodenoscopy, flexible,                            transoral; with biopsy, single or multiple Diagnosis Code(s):        --- Professional ---                           K29.80, Duodenitis without bleeding  R11.0, Nausea CPT copyright 2019 American Medical Association. All rights reserved. The codes documented in this report are preliminary and upon coder review may  be revised to meet current compliance requirements. Beverley Fiedler, MD 04/26/2020 12:49:11 PM This report has been signed electronically. Number of Addenda: 0

## 2020-04-26 NOTE — Transfer of Care (Signed)
Immediate Anesthesia Transfer of Care Note  Patient: Jasmine [name]  Procedure(s) Performed: ESOPHAGOGASTRODUODENOSCOPY (EGD) WITH PROPOFOL (N/A ) BIOPSY  Patient Location: PACU  Anesthesia Type:MAC  Level of Consciousness: sedated  Airway & Oxygen Therapy: Patient Spontanous Breathing and Patient connected to face mask oxygen  Post-op Assessment: Report given to RN and Post -op Vital signs reviewed and stable  Post vital signs: Reviewed and stable  Last Vitals:  Vitals Value Taken Time  BP    Temp    Pulse    Resp    SpO2      Last Pain:  Vitals:   04/26/20 1140  TempSrc: Oral  PainSc: 0-No pain         Complications: No complications documented.

## 2020-04-26 NOTE — Anesthesia Preprocedure Evaluation (Signed)
Anesthesia Evaluation  Patient identified by MRN, date of birth, ID band Patient awake    Reviewed: Allergy & Precautions, NPO status , Patient's Chart, lab work & pertinent test results  History of Anesthesia Complications Negative for: history of anesthetic complications  Airway Mallampati: II  TM Distance: >3 FB Neck ROM: Full    Dental  (+) Dental Advisory Given, Teeth Intact   Pulmonary asthma ,    breath sounds clear to auscultation       Cardiovascular negative cardio ROS   Rhythm:Regular     Neuro/Psych PSYCHIATRIC DISORDERS Anxiety    GI/Hepatic Neg liver ROS, Chronic nausea   Endo/Other  negative endocrine ROS  Renal/GU negative Renal ROSLab Results      Component                Value               Date                      CREATININE               0.54                04/07/2020                Musculoskeletal negative musculoskeletal ROS (+)   Abdominal   Peds  Hematology negative hematology ROS (+) Lab Results      Component                Value               Date                      WBC                      7.3                 04/07/2020                HGB                      14.2                04/07/2020                HCT                      40.4                04/07/2020                MCV                      89.5                04/07/2020                PLT                      370.0               04/07/2020              Anesthesia Other Findings   Reproductive/Obstetrics Lab Results      Component  Value               Date                      HCG                      <5.0                03/07/2020                                       Anesthesia Physical Anesthesia Plan  ASA: II  Anesthesia Plan: MAC   Post-op Pain Management:    Induction:   PONV Risk Score and Plan: 2 and Propofol infusion and Treatment may vary due to age or medical  condition  Airway Management Planned: Nasal Cannula  Additional Equipment: None  Intra-op Plan:   Post-operative Plan:   Informed Consent: I have reviewed the patients History and Physical, chart, labs and discussed the procedure including the risks, benefits and alternatives for the proposed anesthesia with the patient or authorized representative who has indicated his/her understanding and acceptance.     Dental advisory given  Plan Discussed with: Anesthesiologist and CRNA  Anesthesia Plan Comments:         Anesthesia Quick Evaluation

## 2020-04-26 NOTE — Anesthesia Procedure Notes (Signed)
Date/Time: 04/26/2020 12:10 PM Performed by: Jhonnie Garner, CRNA Oxygen Delivery Method: Simple face mask

## 2020-04-26 NOTE — Discharge Instructions (Signed)
YOU HAD AN ENDOSCOPIC PROCEDURE TODAY: Refer to the procedure report and other information in the discharge instructions given to you for any specific questions about what was found during the examination. If this information does not answer your questions, please call [place] office at [phone] to clarify.   YOU SHOULD EXPECT: Some feelings of bloating in the abdomen. Passage of more gas than usual. Walking can help get rid of the air that was put into your GI tract during the procedure and reduce the bloating. If you had a lower endoscopy (such as a colonoscopy or flexible sigmoidoscopy) you may notice spotting of blood in your stool or on the toilet paper. Some abdominal soreness may be present for a day or two, also.  DIET: Your first meal following the procedure should be a light meal and then it is ok to progress to your normal diet. A half-sandwich or bowl of soup is an example of a good first meal. Heavy or fried foods are harder to digest and may make you feel nauseous or bloated. Drink plenty of fluids but you should avoid alcoholic beverages for 24 hours. If you had a esophageal dilation, please see attached instructions for diet.    ACTIVITY: Your care partner should take you home directly after the procedure. You should plan to take it easy, moving slowly for the rest of the day. You can resume normal activity the day after the procedure however YOU SHOULD NOT DRIVE, use power tools, machinery or perform tasks that involve climbing or major physical exertion for 24 hours (because of the sedation medicines used during the test).   SYMPTOMS TO REPORT IMMEDIATELY: A gastroenterologist can be reached at any hour. Please call [phone]  for any of the following symptoms:   Following upper endoscopy (EGD, EUS, ERCP, esophageal dilation) Vomiting of blood or coffee ground material  New, significant abdominal pain  New, significant chest pain or pain under the shoulder blades  Painful or  persistently difficult swallowing  New shortness of breath  Black, tarry-looking or red, bloody stools  FOLLOW UP:  If any biopsies were taken you will be contacted by phone or by letter within the next 1-3 weeks. Call [phone]  if you have not heard about the biopsies in 3 weeks.  Please also call with any specific questions about appointments or follow up tests.  

## 2020-04-27 LAB — SURGICAL PATHOLOGY

## 2020-04-27 NOTE — Anesthesia Postprocedure Evaluation (Signed)
Anesthesia Post Note  Patient: Yi Haydar Rasheed  Procedure(s) Performed: ESOPHAGOGASTRODUODENOSCOPY (EGD) WITH PROPOFOL (N/A ) BIOPSY     Patient location during evaluation: Endoscopy Anesthesia Type: MAC Level of consciousness: awake and alert Pain management: pain level controlled Vital Signs Assessment: post-procedure vital signs reviewed and stable Respiratory status: spontaneous breathing, nonlabored ventilation, respiratory function stable and patient connected to nasal cannula oxygen Cardiovascular status: stable and blood pressure returned to baseline Postop Assessment: no apparent nausea or vomiting Anesthetic complications: no   No complications documented.  Last Vitals:  Vitals:   04/26/20 1250 04/26/20 1300  BP: (!) 134/93   Pulse: 78   Resp: (!) 24   Temp:    SpO2: 97% 98%    Last Pain:  Vitals:   04/26/20 1250  TempSrc:   PainSc: 0-No pain                 Reneka Nebergall

## 2020-04-28 ENCOUNTER — Encounter: Payer: Self-pay | Admitting: Internal Medicine

## 2020-04-28 ENCOUNTER — Ambulatory Visit (INDEPENDENT_AMBULATORY_CARE_PROVIDER_SITE_OTHER): Payer: BC Managed Care – PPO | Admitting: Otolaryngology

## 2020-04-29 ENCOUNTER — Encounter (HOSPITAL_COMMUNITY): Payer: Self-pay | Admitting: Internal Medicine

## 2020-05-09 ENCOUNTER — Telehealth: Payer: Self-pay | Admitting: *Deleted

## 2020-05-09 NOTE — Telephone Encounter (Signed)
I have contacted [name] labs regarding patient's Lactose breath test that was ordered on 04/08/20. Per Theodoro Grist at the lab, he indicates that it was confirmed with patient that she received the kid as of 04/25/20 but as of 05/09/20 has not yet completed the test. I have left a message asking her to return our call to let us know if she intends to complete this testing.

## 2020-05-10 NOTE — Telephone Encounter (Signed)
No more information needed  

## 2020-08-04 ENCOUNTER — Encounter: Payer: Self-pay | Admitting: Adult Health

## 2020-08-04 ENCOUNTER — Ambulatory Visit: Payer: BC Managed Care – PPO | Admitting: Adult Health

## 2020-08-04 VITALS — BP 137/97 | HR 99 | Ht 62.0 in | Wt 200.0 lb

## 2020-08-04 DIAGNOSIS — G40209 Localization-related (focal) (partial) symptomatic epilepsy and epileptic syndromes with complex partial seizures, not intractable, without status epilepticus: Secondary | ICD-10-CM | POA: Diagnosis not present
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
## 2020-08-07 NOTE — Progress Notes (Signed)
I agree with the above plan 

## 2020-08-29 ENCOUNTER — Encounter: Payer: Self-pay | Admitting: Adult Health

## 2020-08-31 ENCOUNTER — Telehealth: Payer: Self-pay | Admitting: *Deleted

## 2020-08-31 NOTE — Telephone Encounter (Signed)
MV form completed to JM/NP review completion and signature.

## 2020-08-31 NOTE — Telephone Encounter (Signed)
Completed and placed in out box.  Thank you. 

## 2020-09-01 NOTE — Telephone Encounter (Signed)
To medical records.

## 2020-09-06 ENCOUNTER — Telehealth: Payer: Self-pay | Admitting: *Deleted

## 2020-09-06 NOTE — Telephone Encounter (Signed)
Pt DMV form faxed on 09/05/20

## 2021-01-27 ENCOUNTER — Encounter (HOSPITAL_COMMUNITY): Payer: Self-pay | Admitting: Emergency Medicine

## 2021-01-27 ENCOUNTER — Ambulatory Visit (HOSPITAL_COMMUNITY)
Admission: EM | Admit: 2021-01-27 | Discharge: 2021-01-27 | Disposition: A | Discharge status: Home / Self Care | Payer: BC Managed Care – PPO | Attending: Family Medicine | Admitting: Family Medicine

## 2021-01-27 ENCOUNTER — Other Ambulatory Visit: Payer: Self-pay

## 2021-01-27 DIAGNOSIS — U071 COVID-19: Secondary | ICD-10-CM | POA: Diagnosis not present

## 2021-01-27 DIAGNOSIS — R059 Cough, unspecified: Secondary | ICD-10-CM | POA: Insufficient documentation

## 2021-01-27 DIAGNOSIS — R110 Nausea: Secondary | ICD-10-CM | POA: Diagnosis not present

## 2021-01-27 DIAGNOSIS — J029 Acute pharyngitis, unspecified: Secondary | ICD-10-CM | POA: Insufficient documentation

## 2021-01-27 DIAGNOSIS — Z8616 Personal history of COVID-19: Secondary | ICD-10-CM | POA: Insufficient documentation

## 2021-01-27 LAB — POCT RAPID STREP A, ED / UC: Streptococcus, Group A Screen (Direct): NEGATIVE

## 2021-01-27 MED ORDER — LIDOCAINE VISCOUS HCL 2 % MT SOLN: 10.0000 mL | OROMUCOSAL | 0 refills | Status: DC | PRN

## 2021-01-27 MED ORDER — DEXAMETHASONE SODIUM PHOSPHATE 10 MG/ML IJ SOLN
INTRAMUSCULAR | Status: AC
  Filled 2021-01-27: qty 1

## 2021-01-27 MED ORDER — DEXAMETHASONE SODIUM PHOSPHATE 10 MG/ML IJ SOLN
10.0000 mg | Freq: Once | INTRAMUSCULAR | Status: AC
  Administered 2021-01-27: 10 mg via INTRAMUSCULAR

## 2021-01-27 NOTE — ED Triage Notes (Signed)
Pt here for sore throat and cough with nasal congestion x 3 days; denies fever

## 2021-01-27 NOTE — ED Provider Notes (Signed)
[facility]    CSN: [id number] Arrival date & time: 01/27/21  1438      History   Chief Complaint Chief Complaint  Patient presents with   Sore Throat   Cough    HPI Jasmine [name] is a 30 y.o. female.   Patient presenting today with 3-day history of severe sore throat, nausea.  Denies fever, chills, cough, shortness of breath, abdominal pain, diarrhea.  Multiple sick contacts recently, all of tested negative for COVID.  History of recurrent strep infections, status post tonsillectomy several years ago.  Took some over-the-counter cold medication with minimal relief.  Past Medical History:  Diagnosis Date   Allergic rhinitis    Allergy    hickory tree; from Dr Chauncy Passy notes   Amygdalolith    Anemia    Asthma    from Dr Chauncy Passy notes   COVID-19 03/2019   Generalized anxiety disorder    Heart murmur    History of positive PPD 2011   Hyperlipidemia    Hypokalemia    from Dr Chauncy Passy notes   Irregular periods    Migraines    Seizures (HCC)    Urticaria    Patient Active Problem List   Diagnosis Date Noted   Chronic nausea    Urticaria 03/09/2019   Nonallergic rhinitis 03/09/2019   Allergic reaction 03/09/2019   Past Surgical History:  Procedure Laterality Date   BIOPSY  04/26/2020   Procedure: BIOPSY;  Surgeon: Beverley Fiedler, MD;  Location: [facility];  Service: Gastroenterology;;   ESOPHAGOGASTRODUODENOSCOPY (EGD) WITH PROPOFOL N/A 04/26/2020   Procedure: ESOPHAGOGASTRODUODENOSCOPY (EGD) WITH PROPOFOL;  Surgeon: Beverley Fiedler, MD;  Location: [facility];  Service: Gastroenterology;  Laterality: N/A;   TONSILLECTOMY     WISDOM TOOTH EXTRACTION      OB History   No obstetric history on file.      Home Medications    Prior to Admission medications   Medication Sig Start Date End Date Taking? Authorizing Provider  lidocaine (XYLOCAINE) 2 % solution Use as directed 10 mLs in the mouth or throat as needed for mouth pain. 01/27/21   Yes Particia Nearing, PA-C  albuterol (VENTOLIN HFA) 108 (90 Base) MCG/ACT inhaler Inhale 1-2 puffs into the lungs every 6 (six) hours as needed for wheezing or shortness of breath.    [provider]  azelastine (ASTELIN) 0.1 % nasal spray Place 1-2 sprays into both nostrils 2 (two) times daily as needed (allergies.). Use in each nostril as directed    [provider]  VYVANSE 60 MG CHEW Chew 60 mg by mouth daily.  01/19/20   [provider]    Family History Family History  Problem Relation Age of Onset   Diabetes Mother    Hypertension Mother    Diabetes Father    Hypertension Father    Allergic rhinitis Brother    Angioedema Neg Hx    Asthma Neg Hx    Atopy Neg Hx    Eczema Neg Hx    Immunodeficiency Neg Hx    Urticaria Neg Hx    Seizures Neg Hx     Social History Social History   Tobacco Use   Smoking status: Never   Smokeless tobacco: Never  Vaping Use   Vaping Use: Never used  Substance Use Topics   Alcohol use: No   Drug use: No     Allergies   Pollen extract-tree extract [pollen extract] and Shagbark hickory   Review of Systems  Review of Systems Per HPI  Physical Exam Triage Vital Signs ED Triage Vitals  Enc Vitals Group     BP 01/27/21 1642 (!) 149/93     Pulse Rate 01/27/21 1642 (!) 108     Resp 01/27/21 1642 18     Temp 01/27/21 1642 98 F (36.7 C)     Temp Source 01/27/21 1642 Oral     SpO2 01/27/21 1642 99 %     Weight --      Height --      Head Circumference --      Peak Flow --      Pain Score 01/27/21 1643 5     Pain Loc --      Pain Edu? --      Excl. in GC? --    No data found.  Updated Vital Signs BP (!) 149/93 (BP Location: Left Arm)   Pulse (!) 108   Temp 98 F (36.7 C) (Oral)   Resp 18   SpO2 99%   Visual Acuity Right Eye Distance:   Left Eye Distance:   Bilateral Distance:    Right Eye Near:   Left Eye Near:    Bilateral Near:     Physical Exam Vitals and nursing note  reviewed.  Constitutional:      Appearance: Normal appearance. She is not ill-appearing.  HENT:     Head: Atraumatic.     Right Ear: Tympanic membrane normal.     Left Ear: Tympanic membrane normal.     Nose: Nose normal.     Mouth/Throat:     Mouth: Mucous membranes are moist.     Pharynx: Oropharynx is clear. Posterior oropharyngeal erythema present. No oropharyngeal exudate.     Comments: Uvula midline, oral airway patent Eyes:     Extraocular Movements: Extraocular movements intact.     Conjunctiva/sclera: Conjunctivae normal.  Cardiovascular:     Rate and Rhythm: Normal rate and regular rhythm.     Heart sounds: Normal heart sounds.  Pulmonary:     Effort: Pulmonary effort is normal. No respiratory distress.     Breath sounds: Normal breath sounds. No wheezing or rales.  Abdominal:     General: Bowel sounds are normal. There is no distension.     Palpations: Abdomen is soft.     Tenderness: There is no abdominal tenderness. There is no guarding.  Musculoskeletal:        General: Normal range of motion.     Cervical back: Normal range of motion and neck supple.  Skin:    General: Skin is warm and dry.  Neurological:     Mental Status: She is alert and oriented to person, place, and time.     Motor: No weakness.     Gait: Gait normal.  Psychiatric:        Mood and Affect: Mood normal.        Thought Content: Thought content normal.        Judgment: Judgment normal.   UC Treatments / Results  Labs (all labs ordered are listed, but only abnormal results are displayed) Labs Reviewed  SARS CORONAVIRUS 2 (TAT 6-24 HRS)  CULTURE, GROUP A STREP [facility])  POCT RAPID STREP A, ED / UC    EKG   Radiology No results found.  Procedures Procedures (including critical care time)  Medications Ordered in UC Medications  dexamethasone (DECADRON) injection 10 mg (10 mg Intramuscular Given 01/27/21 1708)    Initial Impression / Assessment and  Plan / UC Course  I have  reviewed the triage vital signs and the nursing notes.  Pertinent labs & imaging results that were available during my care of the patient were reviewed by me and considered in my medical decision making (see chart for details).     Mildly tachycardic in triage, but otherwise vital signs reassuring.  Overall in no acute distress, rapid strep negative, throat culture and COVID test pending.  Discussed IM Decadron given extent of pharyngeal irritation, viscous lidocaine, over-the-counter supportive medications and home care.  Quarantine reviewed, work note given.  Return for acutely worsening symptoms.  Final Clinical Impressions(s) / UC Diagnoses   Final diagnoses:  Acute pharyngitis, unspecified etiology   Discharge Instructions   None    ED Prescriptions     Medication Sig Dispense Auth. Provider   lidocaine (XYLOCAINE) 2 % solution Use as directed 10 mLs in the mouth or throat as needed for mouth pain. 100 mL Particia Nearing, [place]      PDMP not reviewed this encounter.   Particia Nearing, [place] 01/27/21 1758

## 2021-01-28 LAB — SARS CORONAVIRUS 2 (TAT 6-24 HRS): SARS Coronavirus 2: POSITIVE — AB

## 2021-01-30 LAB — CULTURE, GROUP A STREP (THRC)

## 2021-02-13 ENCOUNTER — Ambulatory Visit: Payer: BC Managed Care – PPO | Admitting: Adult Health

## 2021-02-13 ENCOUNTER — Other Ambulatory Visit: Payer: Self-pay

## 2021-02-13 ENCOUNTER — Encounter: Payer: Self-pay | Admitting: Adult Health

## 2021-02-13 VITALS — BP 142/84 | HR 78 | Ht 62.5 in | Wt 208.0 lb

## 2021-02-13 DIAGNOSIS — G40209 Localization-related (focal) (partial) symptomatic epilepsy and epileptic syndromes with complex partial seizures, not intractable, without status epilepticus: Secondary | ICD-10-CM
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
## 2021-02-13 NOTE — Patient Instructions (Signed)
Your Plan:  No changes today. Continue what you are currently doing. Please call with any seizure type activity or questions or concerns    Follow up in 1 year or call earlier if needed    Thank you for coming to see [place] at [facility]. I hope we have been able to provide you high quality care today.  You may receive a patient satisfaction survey over the next few weeks. We would appreciate your feedback and comments so that we may continue to improve ourselves and the health of our patients.

## 2021-02-19 NOTE — Progress Notes (Signed)
I agree with the above plan 

## 2021-09-26 ENCOUNTER — Encounter: Payer: Self-pay | Admitting: Adult Health

## 2021-10-10 ENCOUNTER — Telehealth: Payer: Self-pay | Admitting: *Deleted

## 2021-10-10 NOTE — Telephone Encounter (Signed)
Received [place] DMV paperwork via my chart, printed and sent to medical records to begin process. Patient is aware she'll need to pay fee. Papers due 10/16/21.

## 2021-10-12 ENCOUNTER — Telehealth: Payer: Self-pay

## 2021-10-12 DIAGNOSIS — Z0289 Encounter for other administrative examinations: Secondary | ICD-10-CM

## 2021-10-12 NOTE — Telephone Encounter (Signed)
LVM for patient to make form fee payment

## 2021-10-12 NOTE — Telephone Encounter (Addendum)
Form fee paid, dropped off with POD 3

## 2021-10-12 NOTE — Telephone Encounter (Signed)
[place] DMV forms received, completed and placed on NP desk for review and signature upon return on Tuesday when office is open.

## 2021-10-12 NOTE — Telephone Encounter (Deleted)
[place] DMV forms received, completed and placed on NP desk for review and signature upon return on Tuesday when office is open. 

## 2021-10-17 NOTE — Telephone Encounter (Signed)
[place] DMV papers sent to Medical records for processing.

## 2021-10-17 NOTE — Telephone Encounter (Signed)
Signed and placed in outbox.  Thank you. ?

## 2021-10-18 NOTE — Telephone Encounter (Signed)
Dmv form faxed today.
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
## 2022-02-14 ENCOUNTER — Encounter: Payer: Self-pay | Admitting: Adult Health

## 2022-02-14 ENCOUNTER — Ambulatory Visit: Payer: BC Managed Care – PPO | Admitting: Adult Health

## 2022-02-14 VITALS — BP 135/88 | HR 97 | Ht 62.5 in | Wt 199.0 lb

## 2022-02-14 DIAGNOSIS — R569 Unspecified convulsions: Secondary | ICD-10-CM | POA: Diagnosis not present

## 2022-02-28 IMAGING — US US ABDOMEN COMPLETE
1 series · 14 of 25 positions shown · non-contrast
Comparison: None

CLINICAL DATA: Chronic nausea

EXAM:
ABDOMEN ULTRASOUND COMPLETE

[Series 1: us abdomen complete · 14 of 71 slices shown]
[im 1/71]
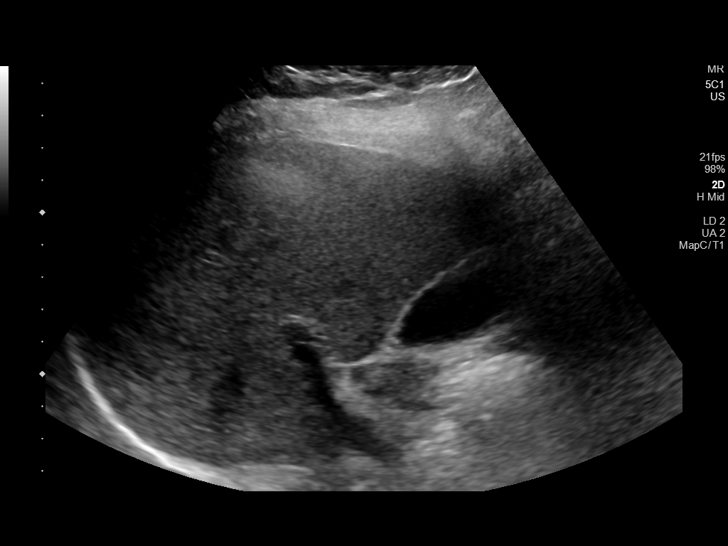
[im 6/71]
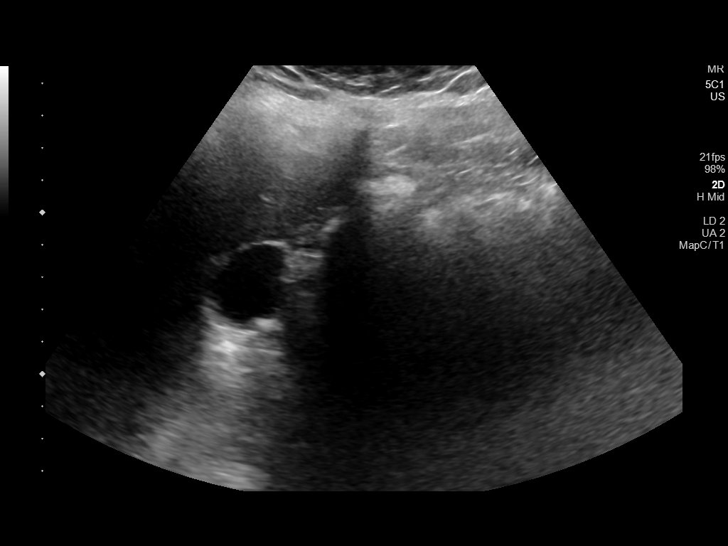
[im 12/71]
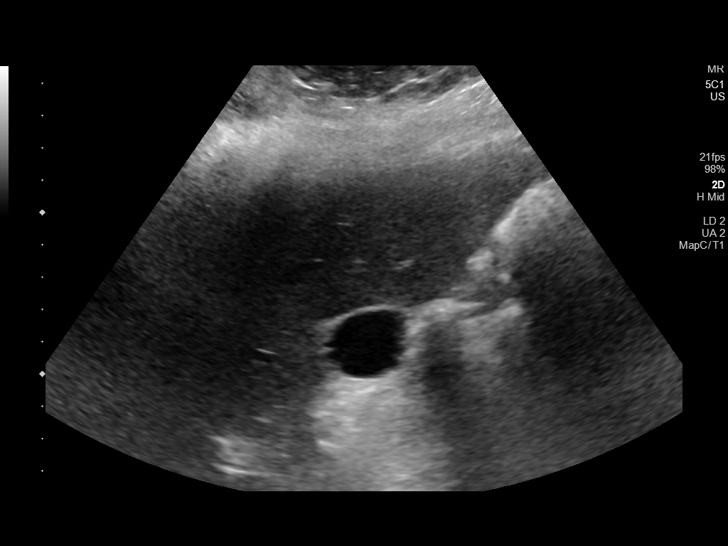
[im 18/71]
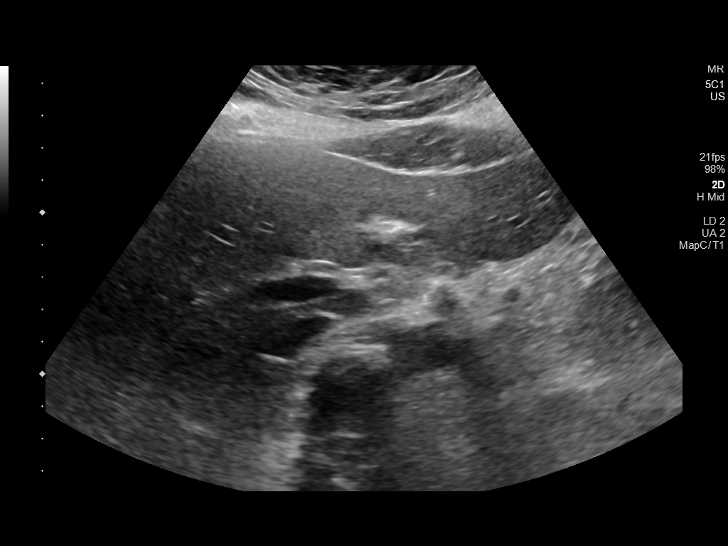
[im 24/71]
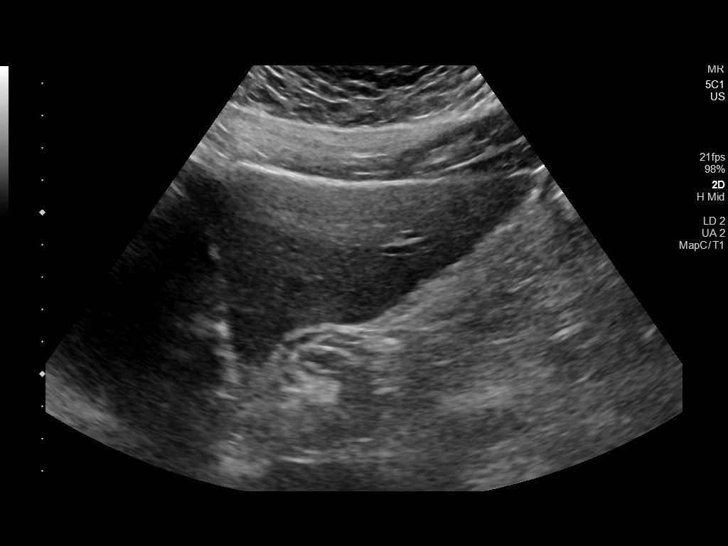
[im 27/71]
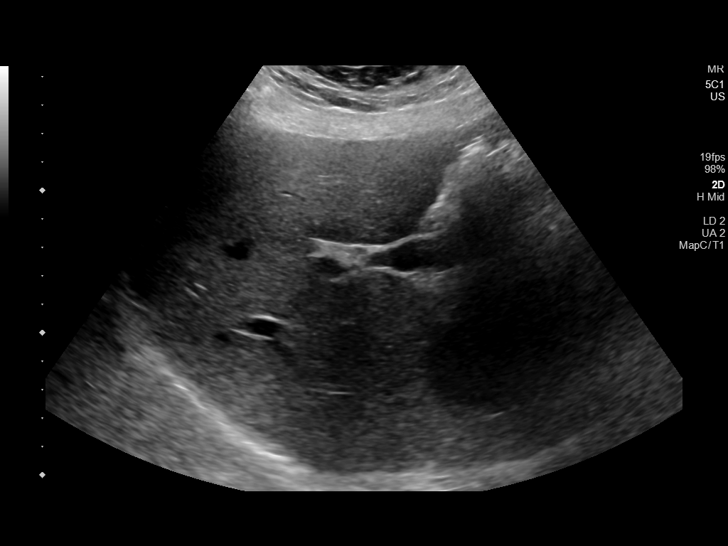
[im 33/71]
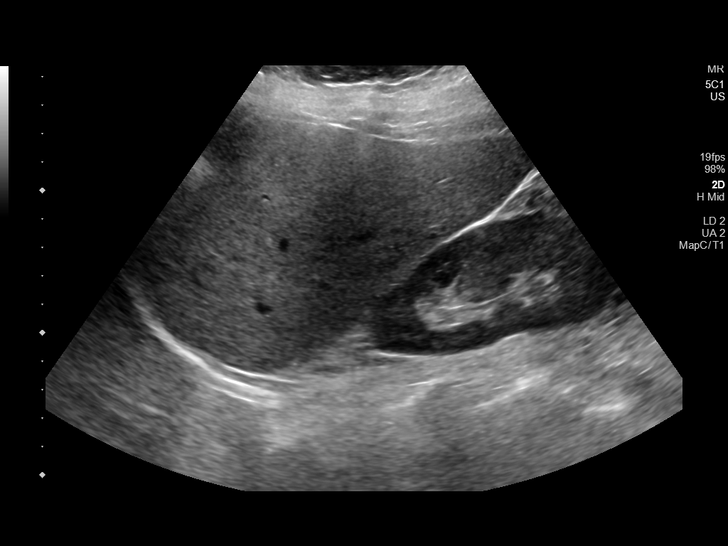
[im 38/71]
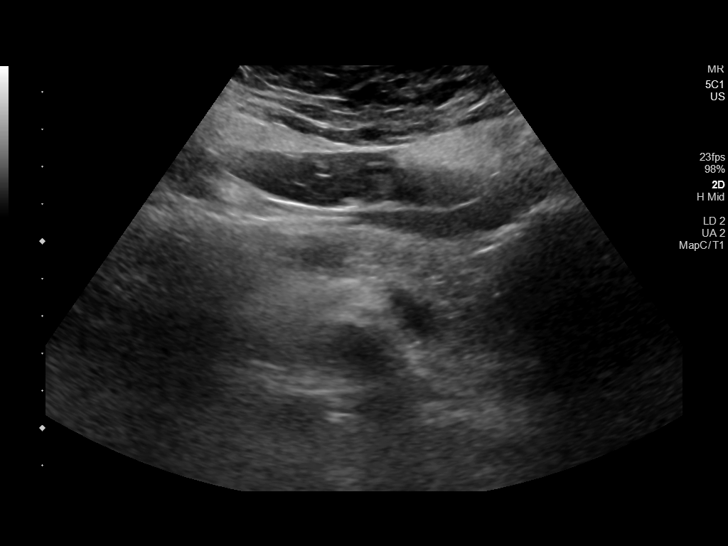
[im 44/71]
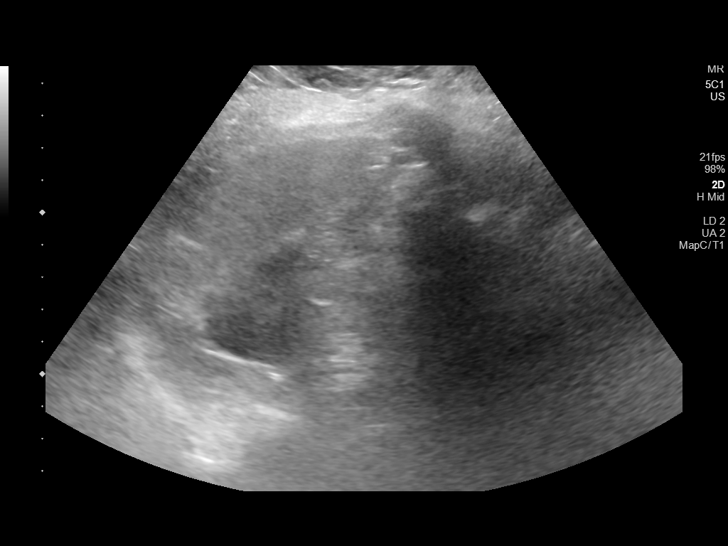
[im 47/71]
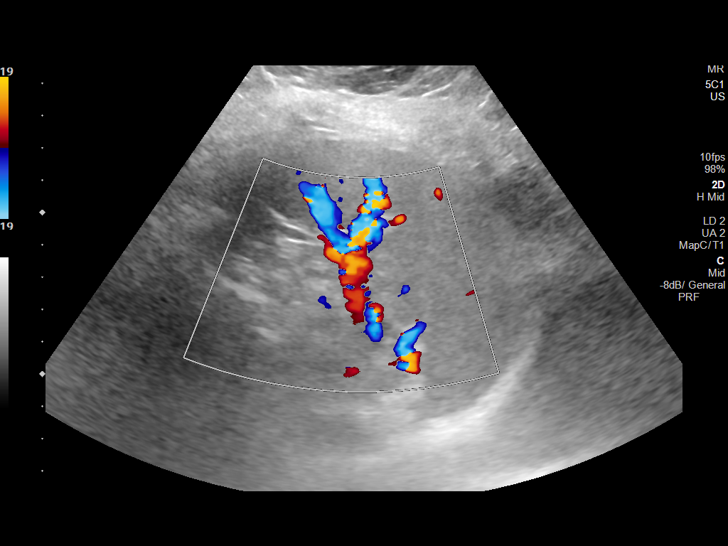
[im 53/71]
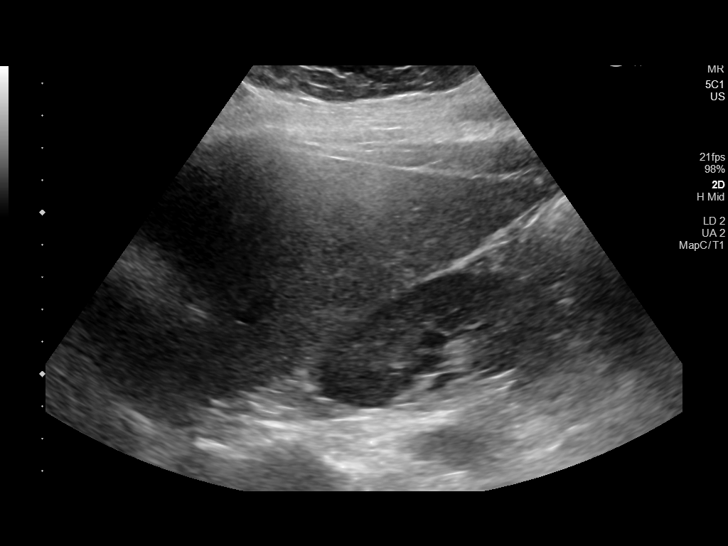
[im 59/71]
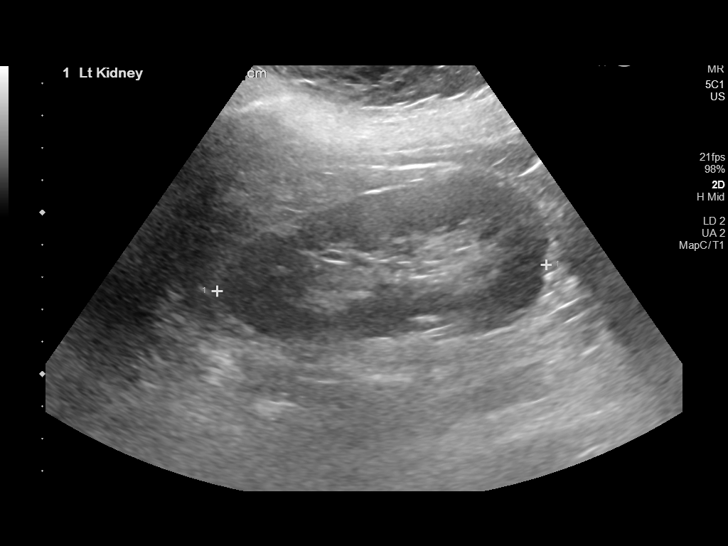
[im 65/71]
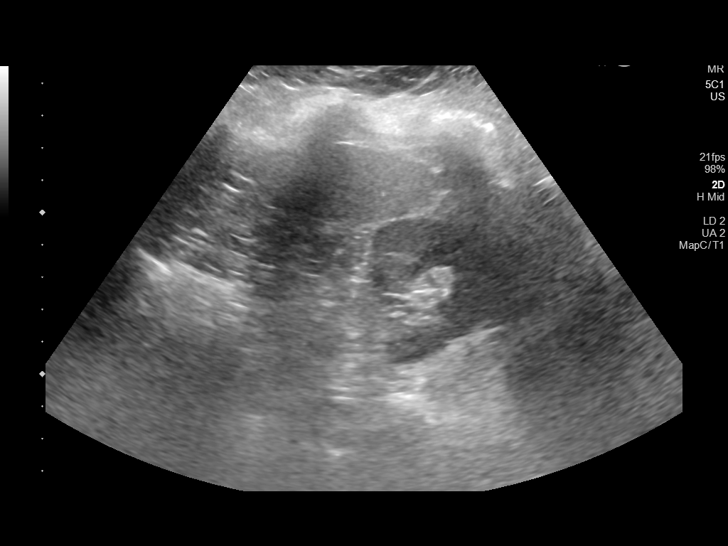
[im 71/71]
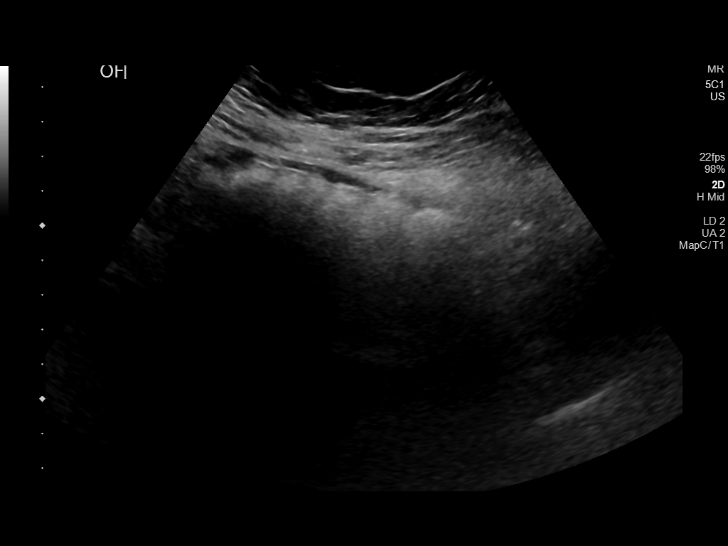

[14 of 25 positions shown; findings below may reference images not displayed]

FINDINGS: Gallbladder: Normally distended without stones or wall thickening.
No pericholecystic fluid or sonographic Murphy sign.

Common bile duct: Diameter: 3 mm, normal

Liver: Echogenic parenchyma, likely fatty infiltration though this
can be seen with cirrhosis and certain infiltrative disorders. No
focal hepatic mass or nodularity. No intrahepatic biliary
dilatation. Portal vein is patent on color Doppler imaging with
normal direction of blood flow towards the liver.

IVC: Normal appearance

Pancreas: Normal appearance

Spleen: Normal appearance, 8.5 cm length

Right Kidney: Length: 9.5 cm. Normal morphology without mass or
hydronephrosis.

Left Kidney: Length: 10.2 cm. Normal morphology without mass or
hydronephrosis.

Abdominal aorta: Normal caliber

Other findings: No free fluid
IMPRESSION: Probable fatty infiltration of liver as above.

Otherwise negative exam.

## 2022-06-02 ENCOUNTER — Other Ambulatory Visit (HOSPITAL_COMMUNITY): Payer: Self-pay

## 2022-06-02 MED ORDER — LISDEXAMFETAMINE DIMESYLATE 60 MG PO CHEW
1.0000 | CHEWABLE_TABLET | Freq: Every day | ORAL | 0 refills | Status: DC
  Filled 2022-06-02: qty 30, 30d supply, fill #0

## 2022-06-04 ENCOUNTER — Other Ambulatory Visit (HOSPITAL_COMMUNITY): Payer: Self-pay

## 2022-06-04 ENCOUNTER — Other Ambulatory Visit: Payer: Self-pay

## 2022-06-05 ENCOUNTER — Other Ambulatory Visit (HOSPITAL_COMMUNITY): Payer: Self-pay

## 2022-07-30 ENCOUNTER — Other Ambulatory Visit (HOSPITAL_COMMUNITY): Payer: Self-pay

## 2022-07-30 MED ORDER — LISDEXAMFETAMINE DIMESYLATE 60 MG PO CHEW
1.0000 | CHEWABLE_TABLET | Freq: Every day | ORAL | 0 refills | Status: DC
  Filled 2022-07-30: qty 30, 30d supply, fill #0

## 2022-09-03 ENCOUNTER — Other Ambulatory Visit (HOSPITAL_COMMUNITY): Payer: Self-pay

## 2022-09-03 MED ORDER — LISDEXAMFETAMINE DIMESYLATE 60 MG PO CHEW
1.0000 | CHEWABLE_TABLET | Freq: Every day | ORAL | 0 refills | Status: DC
  Filled 2022-09-03: qty 30, 30d supply, fill #0

## 2022-10-04 ENCOUNTER — Other Ambulatory Visit (HOSPITAL_COMMUNITY): Payer: Self-pay

## 2022-10-04 MED ORDER — LISDEXAMFETAMINE DIMESYLATE 60 MG PO CHEW
1.0000 | CHEWABLE_TABLET | Freq: Every day | ORAL | 0 refills | Status: DC
  Filled 2022-10-04: qty 30, 30d supply, fill #0

## 2022-11-06 ENCOUNTER — Other Ambulatory Visit (HOSPITAL_COMMUNITY): Payer: Self-pay

## 2022-11-06 MED ORDER — LISDEXAMFETAMINE DIMESYLATE 60 MG PO CHEW
1.0000 | CHEWABLE_TABLET | Freq: Every day | ORAL | 0 refills | Status: DC
  Filled 2022-11-06: qty 30, 30d supply, fill #0

## 2022-11-09 ENCOUNTER — Other Ambulatory Visit (HOSPITAL_COMMUNITY): Payer: Self-pay

## 2022-12-07 ENCOUNTER — Other Ambulatory Visit (HOSPITAL_COMMUNITY): Payer: Self-pay

## 2022-12-07 MED ORDER — LISDEXAMFETAMINE DIMESYLATE 60 MG PO CHEW
1.0000 | CHEWABLE_TABLET | Freq: Every day | ORAL | 0 refills | Status: DC
  Filled 2022-12-07: qty 30, 30d supply, fill #0

## 2023-01-10 ENCOUNTER — Other Ambulatory Visit (HOSPITAL_COMMUNITY): Payer: Self-pay

## 2023-01-10 MED ORDER — LISDEXAMFETAMINE DIMESYLATE 60 MG PO CHEW
60.0000 mg | CHEWABLE_TABLET | Freq: Every day | ORAL | 0 refills | Status: AC
  Filled 2023-02-11: qty 30, 30d supply, fill #0

## 2023-01-10 MED ORDER — LISDEXAMFETAMINE DIMESYLATE 60 MG PO CHEW
1.0000 | CHEWABLE_TABLET | Freq: Every day | ORAL | 0 refills | Status: AC
Start: 2023-01-10 — End: ?
  Filled 2023-01-10: qty 30, 30d supply, fill #0

## 2023-01-10 MED ORDER — LISDEXAMFETAMINE DIMESYLATE 60 MG PO CHEW
60.0000 mg | CHEWABLE_TABLET | Freq: Every day | ORAL | 0 refills | Status: DC
  Filled 2023-03-14: qty 30, 30d supply, fill #0

## 2023-02-08 ENCOUNTER — Other Ambulatory Visit (HOSPITAL_COMMUNITY): Payer: Self-pay

## 2023-02-11 ENCOUNTER — Other Ambulatory Visit (HOSPITAL_COMMUNITY): Payer: Self-pay

## 2023-03-14 ENCOUNTER — Other Ambulatory Visit (HOSPITAL_COMMUNITY): Payer: Self-pay

## 2023-04-10 ENCOUNTER — Other Ambulatory Visit (HOSPITAL_COMMUNITY): Payer: Self-pay

## 2023-04-10 MED ORDER — LISDEXAMFETAMINE DIMESYLATE 60 MG PO CHEW
60.0000 mg | CHEWABLE_TABLET | Freq: Every morning | ORAL | 0 refills | Status: DC
Start: 2023-04-10 — End: 2023-05-16
  Filled 2023-04-10 – 2023-04-11 (×2): qty 30, 30d supply, fill #0

## 2023-04-10 MED ORDER — LISDEXAMFETAMINE DIMESYLATE 20 MG PO CAPS
20.0000 mg | ORAL_CAPSULE | Freq: Every day | ORAL | 0 refills | Status: DC
Start: 2023-04-10 — End: 2023-05-16
  Filled 2023-04-10 – 2023-04-11 (×2): qty 30, 30d supply, fill #0

## 2023-04-11 ENCOUNTER — Other Ambulatory Visit (HOSPITAL_COMMUNITY): Payer: Self-pay

## 2023-05-16 ENCOUNTER — Other Ambulatory Visit (HOSPITAL_COMMUNITY): Payer: Self-pay

## 2023-05-16 MED ORDER — LISDEXAMFETAMINE DIMESYLATE 20 MG PO CAPS
20.0000 mg | ORAL_CAPSULE | Freq: Every day | ORAL | 0 refills | Status: DC
Start: 2023-05-16 — End: 2023-06-19
  Filled 2023-05-16: qty 30, 30d supply, fill #0

## 2023-05-16 MED ORDER — LISDEXAMFETAMINE DIMESYLATE 60 MG PO CHEW
60.0000 mg | CHEWABLE_TABLET | Freq: Every day | ORAL | 0 refills | Status: DC
Start: 2023-05-16 — End: 2023-06-19
  Filled 2023-05-16: qty 30, 30d supply, fill #0

## 2023-06-19 ENCOUNTER — Other Ambulatory Visit (HOSPITAL_COMMUNITY): Payer: Self-pay

## 2023-06-19 MED ORDER — LISDEXAMFETAMINE DIMESYLATE 60 MG PO CHEW
60.0000 mg | CHEWABLE_TABLET | Freq: Every morning | ORAL | 0 refills | Status: AC
Start: 2023-06-19 — End: ?
  Filled 2023-06-19: qty 30, 30d supply, fill #0

## 2023-06-19 MED ORDER — LISDEXAMFETAMINE DIMESYLATE 20 MG PO CAPS
20.0000 mg | ORAL_CAPSULE | Freq: Every day | ORAL | 0 refills | Status: DC
Start: 2023-06-19 — End: 2023-10-08
  Filled 2023-08-28: qty 30, 30d supply, fill #0

## 2023-06-19 MED ORDER — LISDEXAMFETAMINE DIMESYLATE 20 MG PO CAPS
20.0000 mg | ORAL_CAPSULE | Freq: Every day | ORAL | 0 refills | Status: AC
  Filled 2023-06-19 – 2023-07-22 (×2): qty 30, 30d supply, fill #0

## 2023-06-19 MED ORDER — LISDEXAMFETAMINE DIMESYLATE 60 MG PO CHEW
60.0000 mg | CHEWABLE_TABLET | Freq: Every day | ORAL | 0 refills | Status: AC
Start: 2023-06-19 — End: ?
  Filled 2023-07-22 – 2023-08-28 (×2): qty 30, 30d supply, fill #0

## 2023-06-19 MED ORDER — LISDEXAMFETAMINE DIMESYLATE 20 MG PO CAPS
20.0000 mg | ORAL_CAPSULE | Freq: Every day | ORAL | 0 refills | Status: AC
Start: 2023-06-19 — End: ?
  Filled 2023-06-19 (×2): qty 30, 30d supply, fill #0

## 2023-06-19 MED ORDER — LISDEXAMFETAMINE DIMESYLATE 60 MG PO CHEW
60.0000 mg | CHEWABLE_TABLET | Freq: Every day | ORAL | 0 refills | Status: AC
Start: 2023-06-19 — End: ?
  Filled 2023-07-22: qty 30, 30d supply, fill #0

## 2023-07-22 ENCOUNTER — Other Ambulatory Visit (HOSPITAL_COMMUNITY): Payer: Self-pay

## 2023-08-22 ENCOUNTER — Other Ambulatory Visit (HOSPITAL_COMMUNITY): Payer: Self-pay

## 2023-08-28 ENCOUNTER — Other Ambulatory Visit (HOSPITAL_COMMUNITY): Payer: Self-pay

## 2023-10-07 ENCOUNTER — Other Ambulatory Visit (HOSPITAL_COMMUNITY): Payer: Self-pay

## 2023-10-07 ENCOUNTER — Other Ambulatory Visit: Payer: Self-pay

## 2023-10-08 ENCOUNTER — Other Ambulatory Visit (HOSPITAL_COMMUNITY): Payer: Self-pay

## 2023-10-08 MED ORDER — LISDEXAMFETAMINE DIMESYLATE 20 MG PO CAPS
20.0000 mg | ORAL_CAPSULE | Freq: Every day | ORAL | 0 refills | Status: AC
  Filled 2023-10-08: qty 30, 30d supply, fill #0

## 2023-10-08 MED ORDER — LISDEXAMFETAMINE DIMESYLATE 60 MG PO CHEW
60.0000 mg | CHEWABLE_TABLET | Freq: Every morning | ORAL | 0 refills | Status: AC
Start: 2023-10-08 — End: ?
  Filled 2023-10-08: qty 30, 30d supply, fill #0

## 2023-10-11 ENCOUNTER — Other Ambulatory Visit (HOSPITAL_COMMUNITY): Payer: Self-pay

## 2023-11-11 ENCOUNTER — Other Ambulatory Visit (HOSPITAL_COMMUNITY): Payer: Self-pay

## 2023-11-12 ENCOUNTER — Other Ambulatory Visit (HOSPITAL_COMMUNITY): Payer: Self-pay

## 2023-11-12 ENCOUNTER — Other Ambulatory Visit: Payer: Self-pay

## 2023-11-12 MED ORDER — AMPHETAMINE-DEXTROAMPHET ER 25 MG PO CP24
25.0000 mg | ORAL_CAPSULE | Freq: Every morning | ORAL | 0 refills | Status: DC
  Filled 2023-11-12: qty 10, 10d supply, fill #0
  Filled 2023-11-12: qty 20, 20d supply, fill #0

## 2023-11-12 MED ORDER — AMPHETAMINE-DEXTROAMPHETAMINE 10 MG PO TABS
10.0000 mg | ORAL_TABLET | Freq: Every day | ORAL | 0 refills | Status: DC
  Filled 2023-11-12: qty 30, 30d supply, fill #0

## 2023-11-18 ENCOUNTER — Other Ambulatory Visit (HOSPITAL_COMMUNITY): Payer: Self-pay

## 2023-11-25 ENCOUNTER — Other Ambulatory Visit (HOSPITAL_COMMUNITY): Payer: Self-pay

## 2023-12-09 ENCOUNTER — Other Ambulatory Visit (HOSPITAL_COMMUNITY): Payer: Self-pay

## 2023-12-09 MED ORDER — AMPHETAMINE-DEXTROAMPHETAMINE 10 MG PO TABS
10.0000 mg | ORAL_TABLET | Freq: Every day | ORAL | 0 refills | Status: DC
  Filled 2023-12-10: qty 30, 30d supply, fill #0

## 2023-12-09 MED ORDER — AMPHETAMINE-DEXTROAMPHET ER 25 MG PO CP24
25.0000 mg | ORAL_CAPSULE | Freq: Every morning | ORAL | 0 refills | Status: DC
  Filled 2023-12-10: qty 30, 30d supply, fill #0

## 2023-12-10 ENCOUNTER — Other Ambulatory Visit: Payer: Self-pay

## 2023-12-10 ENCOUNTER — Other Ambulatory Visit (HOSPITAL_COMMUNITY): Payer: Self-pay

## 2024-01-13 ENCOUNTER — Other Ambulatory Visit (HOSPITAL_COMMUNITY): Payer: Self-pay

## 2024-01-13 MED ORDER — AMPHETAMINE-DEXTROAMPHET ER 25 MG PO CP24
25.0000 mg | ORAL_CAPSULE | ORAL | 0 refills | Status: AC
  Filled 2024-01-13 (×2): qty 30, 30d supply, fill #0

## 2024-01-13 MED ORDER — AMPHETAMINE-DEXTROAMPHETAMINE 10 MG PO TABS
10.0000 mg | ORAL_TABLET | Freq: Every day | ORAL | 0 refills | Status: AC
  Filled 2024-01-13 (×3): qty 30, 30d supply, fill #0

## 2024-02-12 ENCOUNTER — Other Ambulatory Visit (HOSPITAL_COMMUNITY): Payer: Self-pay

## 2024-02-12 MED ORDER — VYVANSE 60 MG PO CAPS
60.0000 mg | ORAL_CAPSULE | Freq: Every morning | ORAL | 0 refills | Status: DC
  Filled 2024-02-12: qty 30, 30d supply, fill #0

## 2024-02-13 ENCOUNTER — Other Ambulatory Visit (HOSPITAL_COMMUNITY): Payer: Self-pay

## 2024-03-16 ENCOUNTER — Other Ambulatory Visit (HOSPITAL_COMMUNITY): Payer: Self-pay

## 2024-03-16 MED ORDER — VYVANSE 60 MG PO CAPS
60.0000 mg | ORAL_CAPSULE | Freq: Every morning | ORAL | 0 refills | Status: DC
  Filled 2024-03-16: qty 30, 30d supply, fill #0

## 2024-04-20 ENCOUNTER — Other Ambulatory Visit (HOSPITAL_COMMUNITY): Payer: Self-pay

## 2024-04-20 MED ORDER — VYVANSE 60 MG PO CAPS
60.0000 mg | ORAL_CAPSULE | Freq: Every morning | ORAL | 0 refills | Status: DC
  Filled 2024-04-20: qty 30, 30d supply, fill #0

## 2024-05-22 ENCOUNTER — Other Ambulatory Visit (HOSPITAL_COMMUNITY): Payer: Self-pay

## 2024-05-22 MED ORDER — VYVANSE 60 MG PO CAPS
60.0000 mg | ORAL_CAPSULE | Freq: Every morning | ORAL | 0 refills | Status: DC
  Filled 2024-05-22: qty 30, 30d supply, fill #0

## 2024-06-05 ENCOUNTER — Other Ambulatory Visit (HOSPITAL_COMMUNITY): Payer: Self-pay

## 2024-06-18 ENCOUNTER — Other Ambulatory Visit (HOSPITAL_COMMUNITY): Payer: Self-pay

## 2024-06-18 MED ORDER — VYVANSE 60 MG PO CAPS
60.0000 mg | ORAL_CAPSULE | Freq: Every morning | ORAL | 0 refills | Status: AC
  Filled 2024-06-19: qty 30, 30d supply, fill #0

## 2024-06-19 ENCOUNTER — Other Ambulatory Visit (HOSPITAL_COMMUNITY): Payer: Self-pay
